# Patient Record
Sex: Female | Born: 1965 | Race: White | Hispanic: No | Smoking: Current some day smoker
Health system: Southern US, Community
[De-identification: ages and names within clinical notes are randomized; demographics above are authoritative.]

## PROBLEM LIST (undated history)

## (undated) DIAGNOSIS — F329 Major depressive disorder, single episode, unspecified: Secondary | ICD-10-CM

## (undated) DIAGNOSIS — F32A Depression, unspecified: Secondary | ICD-10-CM

## (undated) DIAGNOSIS — I10 Essential (primary) hypertension: Secondary | ICD-10-CM

## (undated) HISTORY — PX: BACK SURGERY: SHX140

## (undated) HISTORY — PX: ADENOIDECTOMY: SUR15

## (undated) HISTORY — PX: EYE SURGERY: SHX253

## (undated) HISTORY — PX: SPINE SURGERY: SHX786

## (undated) HISTORY — PX: TONSILLECTOMY: SUR1361

## (undated) HISTORY — PX: ABDOMINAL HYSTERECTOMY: SHX81

## (undated) HISTORY — DX: Essential (primary) hypertension: I10

---

## 1999-04-01 ENCOUNTER — Inpatient Hospital Stay (HOSPITAL_COMMUNITY): Admission: AD | Admit: 1999-04-01 | Discharge: 1999-04-05 | Payer: Self-pay | Admitting: *Deleted

## 1999-05-16 ENCOUNTER — Other Ambulatory Visit: Admission: RE | Admit: 1999-05-16 | Discharge: 1999-05-16 | Payer: Self-pay | Admitting: *Deleted

## 2000-04-27 ENCOUNTER — Ambulatory Visit (HOSPITAL_COMMUNITY): Admission: RE | Admit: 2000-04-27 | Discharge: 2000-04-28 | Payer: Self-pay | Admitting: Ophthalmology

## 2000-04-27 ENCOUNTER — Encounter: Payer: Self-pay | Admitting: Ophthalmology

## 2000-05-06 ENCOUNTER — Ambulatory Visit (HOSPITAL_COMMUNITY): Admission: RE | Admit: 2000-05-06 | Discharge: 2000-05-07 | Payer: Self-pay | Admitting: Ophthalmology

## 2000-10-28 ENCOUNTER — Encounter: Payer: Self-pay | Admitting: Emergency Medicine

## 2000-10-28 ENCOUNTER — Emergency Department (HOSPITAL_COMMUNITY): Admission: EM | Admit: 2000-10-28 | Discharge: 2000-10-28 | Payer: Self-pay | Admitting: Emergency Medicine

## 2000-11-10 ENCOUNTER — Other Ambulatory Visit: Admission: RE | Admit: 2000-11-10 | Discharge: 2000-11-10 | Payer: Self-pay | Admitting: *Deleted

## 2001-11-01 ENCOUNTER — Other Ambulatory Visit: Admission: RE | Admit: 2001-11-01 | Discharge: 2001-11-01 | Payer: Self-pay | Admitting: *Deleted

## 2004-12-18 ENCOUNTER — Emergency Department (HOSPITAL_COMMUNITY): Admission: EM | Admit: 2004-12-18 | Discharge: 2004-12-18 | Payer: Self-pay | Admitting: Family Medicine

## 2005-03-20 ENCOUNTER — Ambulatory Visit: Payer: Self-pay | Admitting: Psychiatry

## 2005-03-20 ENCOUNTER — Inpatient Hospital Stay (HOSPITAL_COMMUNITY): Admission: RE | Admit: 2005-03-20 | Discharge: 2005-03-22 | Payer: Self-pay | Admitting: Psychiatry

## 2005-03-20 ENCOUNTER — Emergency Department (HOSPITAL_COMMUNITY): Admission: EM | Admit: 2005-03-20 | Discharge: 2005-03-20 | Payer: Self-pay | Admitting: Emergency Medicine

## 2005-10-06 ENCOUNTER — Other Ambulatory Visit: Admission: RE | Admit: 2005-10-06 | Discharge: 2005-10-06 | Payer: Self-pay | Admitting: Obstetrics & Gynecology

## 2005-10-13 ENCOUNTER — Encounter: Admission: RE | Admit: 2005-10-13 | Discharge: 2005-10-13 | Payer: Self-pay | Admitting: Obstetrics & Gynecology

## 2009-01-29 ENCOUNTER — Encounter: Admission: RE | Admit: 2009-01-29 | Discharge: 2009-01-29 | Payer: Self-pay | Admitting: Internal Medicine

## 2010-09-08 ENCOUNTER — Emergency Department (HOSPITAL_COMMUNITY)
Admission: EM | Admit: 2010-09-08 | Discharge: 2010-09-08 | Disposition: A | Discharge status: Home / Self Care | Payer: Self-pay | Attending: Emergency Medicine | Admitting: Emergency Medicine

## 2010-09-08 ENCOUNTER — Emergency Department (HOSPITAL_COMMUNITY): Payer: Self-pay

## 2010-09-08 DIAGNOSIS — R079 Chest pain, unspecified: Secondary | ICD-10-CM | POA: Insufficient documentation

## 2010-09-08 DIAGNOSIS — R000 Tachycardia, unspecified: Secondary | ICD-10-CM | POA: Insufficient documentation

## 2010-09-08 DIAGNOSIS — F1410 Cocaine abuse, uncomplicated: Secondary | ICD-10-CM | POA: Insufficient documentation

## 2010-09-08 LAB — CBC
MCV: 87.5 fL (ref 78.0–100.0)
Platelets: 341 10*3/uL (ref 150–400)
RBC: 4.8 MIL/uL (ref 3.87–5.11)
WBC: 15.4 10*3/uL — ABNORMAL HIGH (ref 4.0–10.5)

## 2010-09-08 LAB — DIFFERENTIAL
Basophils Relative: 0 % (ref 0–1)
Eosinophils Absolute: 0 10*3/uL (ref 0.0–0.7)
Lymphs Abs: 1.8 10*3/uL (ref 0.7–4.0)
Neutrophils Relative %: 79 % — ABNORMAL HIGH (ref 43–77)

## 2010-09-08 LAB — COMPREHENSIVE METABOLIC PANEL
ALT: 25 U/L (ref 0–35)
Albumin: 4.3 g/dL (ref 3.5–5.2)
Alkaline Phosphatase: 61 U/L (ref 39–117)
Potassium: 3.6 mEq/L (ref 3.5–5.1)
Sodium: 135 mEq/L (ref 135–145)
Total Protein: 7.4 g/dL (ref 6.0–8.3)

## 2010-09-08 LAB — D-DIMER, QUANTITATIVE: D-Dimer, Quant: 0.22 ug/mL-FEU (ref 0.00–0.48)

## 2010-12-19 NOTE — Op Note (Signed)
. [facility]  Patient:    Karen Serrano, Karen Serrano                    MRN: [id number] Proc. Date: 05/06/00 Adm. Date:  [id number] Disc. Date: [id number] Attending:  Bertrum Sol                           Operative Report  DATE OF BIRTH:  11/08/1965  ADMITTING DIAGNOSES:  Rhegmatogenous retinal detachment right eye, lattice degeneration right eye.  PROCEDURES:  Scleral buckle right eye, retinal photocoagulation right eye.  SURGEON:  Beulah Gandy. Ashley Royalty, M.D.  ASSISTANT:  Adela Ports, R.N.  ANESTHESIA:  General.  DESCRIPTION OF PROCEDURE:  Usual prep and drape, 360 degree ______  peritomy, isolation of four rectus muscles on 2-0 silk.  Scleral dissection from 3:30 oclock to 12 oclock to admit a #279 interscleral implant.  ______  placed in the bed.  Perforation site chosen at 9 oclock.  A large amount of clear caudalis subretinal fluid came forth.  Indirect ophthalmoscopy showed additional fluid remaining below.  Another second perforation site was chosen at 8 oclock in the posterior aspect of the bed.  A moderate amount of clear caudalis subretinal fluid came forth.  A 508 G radial segment was fashioned to fit beneath the bed and placed to support the break and the drain.  Scleral sutures were placed in each quadrant, two per quadrant.  The 240 band was placed around the eye with a 270 sleeve at 2 oclock, belt loop was at 2:30 oclock.  The buckle elements were placed, and the scleral flaps were closed. The indirect ophthalmoscopy showed the retina to be lying nicely on the scleral buckle with the proper indentation of the globe.  The band was adjusted and the ends trimmed.  The sutures were adjusted and the ends trimmed.  The conjunctiva was reposited with 7-0 chromic suture.  The indirect ophthalmoscope laser was moved into place.  824 burns were placed around the retinal periphery around the retinal breaks with a power of 600  milliwatts, 1000 microns each and .1 seconds each.  Once this was completed, all the fluid was gone from the subretinal space, and the conjunctiva was reposited. Polymyxin and gentamicin were irrigated in the tenons space.  Atropine solution was applied.  Decadron 20 mg was injected into the lower subconjunctival space.  Atropine solution was applied.  Polysporin, a patch, and shield were placed.  Closing tension was 10 with a Barr keratometer. Complications none.  Duration two hours.  The patient was awakened and taken to recovery in satisfactory condition. DD:  05/06/00 TD:  05/07/00 Job: [id number]

## 2010-12-19 NOTE — H&P (Signed)
NAME:  Karen Serrano, Karen Serrano             ACCOUNT NO.:  [id number]   MEDICAL RECORD NO.:  [id number]          PATIENT TYPE:  IPS   LOCATION:  0503                          [facility]ACILITY[facility]  [facility]   PHYSICIAN:  Anselm Jungling, MD  DATE OF BIRTH:  30-Dec-1965   DATE OF ADMISSION:  03/20/2005  DATE OF DISCHARGE:                         PSYCHIATRIC ADMISSION ASSESSMENT   IDENTIFYING INFORMATION:  This is a 45 year old single white female who does  have a significant other of 10 years.  Apparently she presented to the  emergency department at [facility] yesterday requesting detoxification from  alcohol.  Her alcohol level was 8 and her urine drug screen was positive for  THC.  The patient states that last year from February until August she was  sober.  She was also sober again this year at the end of April, however she  relapsed in June while at the beach with her [place].   PAST PSYCHIATRIC HISTORY:  She states that she has been depressed since age  76 until she was about 7.  After she had her son, her chemicals balanced  out.  However in April of 2002 she had her first panic attack.  She was  started on Paxil.  She gained over 40 pounds.  She has had a variety of  medication trials throughout her lifetime.  She is currently prescribed  Lexapro 20 mg p.o. daily and Klonopin 0.5 mg p.o. b.i.d. by her primary care  Jessejames Steelman, Dr[facility] Particia Lather [facility].   SOCIAL HISTORY:  She has had some college.  She is employed as an Arts development officer.  She has been married once.  She is now in a relationship for over 10  years.  They have one son, age 31, and she has 2 step-daughters ages 49 and  58.   FAMILY HISTORY:  She states her whole family is alcoholic, both sides.   ALCOHOL AND DRUG HISTORY:  She began drinking alcohol around age 75.  She  noticed that it calmed her tremors.  She has some degree of shakiness, and  she has been using it on and off for the past 10 years.   PAST MEDICAL HISTORY:  She  had back surgery in January of 1993 and January  of 1995.  This was secondary to a herniated disk through weightlifting.  In  August of 2001, both retinas detached bilaterally.  She states that she has  all the signs and symptoms of thyroid disease, however her tests never  proved that out.  She is scheduled to see Dr. Leslie Dales, an endocrinologist,  on August 30.   MEDICATIONS:  As already stated, she is currently prescribed Lexapro 20 mg  p.o. daily, and Klonopin she can take 0.5 up to b.i.d.  She states that when  and if she takes it she takes a half of the 0.5 once a day.   ALLERGIES:  No known drug allergies.   POSITIVE PHYSICAL FINDINGS:  PHYSICAL EXAMINATION:  Was documented in the  emergency room.  She displays no tremor today, in fact she is quite  surprised.  She said she  has never been this calm.  Vital signs show that  she is 69 inches tall, weighs 210.  Temperature is 97.  Her blood pressure  ranges from 147/103 to 150/108, and her pulse is 105 to 106, and  respirations are 26.  The remainder of her physical exam is well documented  from the emergency room.   MENTAL STATUS EXAM:  She is alert and oriented x3.  Her appearance and  behavior show that she is appropriately groomed, dressed, and adequately  nourished.  Her speech is not pressured.  Her mood is depressed and anxious  but appropriate to the situation.  Her thought processes are clear, rational  and goal oriented.  She wants to get detoxed.  Judgment and insight are  intact.  Concentration and memory are intact.  Intelligence is at least  average.  She denies suicidal or homicidal ideation.  She denies auditory or  visual hallucinations.   ADMISSION DIAGNOSES:  AXIS I:  Alcohol dependence and anxiety.  AXIS II:  Deferred.  AXIS III:  Status post back surgeries and bilateral detached retinas, rule  out endocrine disease.  AXIS IV:  Work and using alcohol.  AXIS V:  Global assessment of function is 45.   PLAN:   The plan is to support through detoxification, to augment her  medications if indicated with Camprel, and to help set her up with further  support once discharged.      Mickie Leonarda Salon, P.A.-C.      Anselm Jungling, MD  Electronically Signed    MD/MEDQ  D:  03/21/2005  T:  03/21/2005  Job:  [id number]

## 2010-12-19 NOTE — Op Note (Signed)
[place]  Patient:    Karen Serrano, Karen Serrano                    MRN: [id number] Proc. Date: 04/27/00 Adm. Date:  [id number] Disc. Date: [id number] Attending:  Bertrum Sol                           Operative Report  DATE OF BIRTH:  09-Oct-1965  PREOPERATIVE DIAGNOSIS:  Rhegmatogenous retinal detachment, left eye.  PROCEDURE: 1. Scleral buckle, left eye. 2. Retinal photocoagulation, left eye.  SURGEON:  Beulah Gandy. Ashley Royalty, M.D.  ASSISTANT:  Alma Downs, P.A.  ANESTHESIA:  General.  DESCRIPTION OF PROCEDURE:  The usual prep and drape.  A 360-degree limbal peritomy.  Isolation of four rectus muscles on #2-0 silk.  Localization of a break at 5 oclock.  Scleral dissection from 3 oclock to 6 oclock to admit a #279 intrascleral implant.  Diathermy placed in the bed.  The #279 implant placed against the bed.  A 508-G segment shaped in a radial shape to go beneath the #279 implant.  A #240 band was placed around the eye with a #370 sleeve at 10 oclock, and belt loops at 11 oclock, 2 oclock, 3 oclock, 8 oclock, and 9 oclock.  The perforation site in the anterior aspect of the bed.  A large amount of clear colorless subretinal fluid came forth.  Once this was complete, the buckle elements were placed.  The 508-G segment was placed.  The scleral flaps were closed with three interrupted #7-0 Mersilene sutures.  The band was adjusted to a proper indentation of the globe.  An indirect ophthalmoscopy showed the retina to be lying nicely on the scleral buckle.  The indirect ophthalmoscopy showed the retina to be lying nicely on the scleral buckle.  The indirect ophthalmoscope laser was moved into place, and 1026 burns with a power of 500 mW, 1000 microns each, and 0.1 second each were placed around the breaks on the buckle, and around multiple breaks in the superior half of the retina from lattice degeneration.  Once this was accomplished the  buckle ends were trimmed.  The sutures were trimmed, and the band ends were trimmed.  The conjunctiva was reposited with #7-0 chromic suture.  Polymyxin and gentamicin were irrigated into Tenons space.  Atropine solution was applied.  Decadron 10 mg was injected into the lower subconjunctival space.  Marcaine was injected around the globe for postoperative pain.  Polysporin, a patch, and a shield were placed.  The closing tension was 10 with the Barraquer tonometer.  Complications:  None. Duration of the procedure was two hours.  The patient was awakened and taken to the recovery room in satisfactory condition.DD:  04/27/00 TD:  04/28/00 Job: [id number]

## 2010-12-19 NOTE — Discharge Summary (Signed)
NAME:  Karen Serrano, Karen Serrano             ACCOUNT NO.:  [id number]   MEDICAL RECORD NO.:  [id number]          PATIENT TYPE:  IPS   LOCATION:  0503                          [facility]ILITY[facility]  [facility]   PHYSICIAN:  Anselm Jungling, MD  DATE OF BIRTH:  11-25-1965   DATE OF ADMISSION:  03/20/2005  DATE OF DISCHARGE:  03/22/2005                                 DISCHARGE SUMMARY   IDENTIFYING DATA AND REASON FOR ADMISSION:  This was the first [facility] admission  for Karen Serrano, a 45 year old female admitted for detoxification from alcohol  dependence.  She also came to [place] with a history of some depression as well,  but was admitted without any current suicidal ideation.  She had presented  herself for detoxification at the emergency department.  She had been taking  Lexapro 20 mg daily.  Please refer to the admission note for further details  pertaining to the symptoms, circumstances, and history that led to her  hospitalization at [facility].   MEDICAL AND LABORATORY:  There were no significant medical issues during  this brief inpatient psychiatric stay.  An admission CBC was within normal  limits.  Routine chemistry was only significant for reduced albumin at 3.2.  A TSH level was within normal limits.  The patient was given a multivitamin  daily and calcium plus daily.   HOSPITAL COURSE:  The patient was admitted to the adult inpatient service  where she was placed on a Librium protocol and given her usual Lexapro for  depression.  Her detoxification proceeded uneventfully.  On the third  hospital day, she indicated that she was feeling physically better, sleeping  well, with good appetite.  Her mood was continuing to be somewhat depressed  but she had no suicidal ideation.  She expressed interest in the [facility]  Dependency Intensive Outpatient Program, but also anticipated getting  heavily involved in Alcoholics Anonymous.   The patient was discharged on the third hospital day.   AFTERCARE PLANS:  The patient was  discharged on a regimen of Lexapro 20 mg  p.o. daily, Klonopin 0.5 mg p.r.n. up to b.i.d. for anxiety.  She was to  follow up at the [facility] Intensive Outpatient Program immediately following her  discharge from the inpatient service.   DISCHARGE DIAGNOSES:  AXIS I:  Alcohol dependence, depressive disorder not  otherwise specified.  AXIS II:  Deferred.  AXIS III:  No acute or chronic illnesses.  AXIS IV:  Stressors, severe.  AXIS V:  Global assessment of function on discharge 65.           ______________________________  Anselm Jungling, MD  Electronically Signed     SPB/MEDQ  D:  04/16/2005  T:  04/16/2005  Job:  [id number]

## 2012-09-02 ENCOUNTER — Emergency Department (HOSPITAL_COMMUNITY): Payer: Medicare Other

## 2012-09-02 ENCOUNTER — Encounter (HOSPITAL_COMMUNITY): Payer: Self-pay

## 2012-09-02 ENCOUNTER — Emergency Department (HOSPITAL_COMMUNITY)
Admission: EM | Admit: 2012-09-02 | Discharge: 2012-09-02 | Discharge status: Against Medical Advice | Payer: Medicare Other | Attending: Emergency Medicine | Admitting: Emergency Medicine

## 2012-09-02 DIAGNOSIS — F17200 Nicotine dependence, unspecified, uncomplicated: Secondary | ICD-10-CM | POA: Insufficient documentation

## 2012-09-02 DIAGNOSIS — F3289 Other specified depressive episodes: Secondary | ICD-10-CM | POA: Insufficient documentation

## 2012-09-02 DIAGNOSIS — Z79899 Other long term (current) drug therapy: Secondary | ICD-10-CM | POA: Insufficient documentation

## 2012-09-02 DIAGNOSIS — E860 Dehydration: Secondary | ICD-10-CM

## 2012-09-02 DIAGNOSIS — J40 Bronchitis, not specified as acute or chronic: Secondary | ICD-10-CM

## 2012-09-02 DIAGNOSIS — R509 Fever, unspecified: Secondary | ICD-10-CM | POA: Insufficient documentation

## 2012-09-02 DIAGNOSIS — R110 Nausea: Secondary | ICD-10-CM | POA: Insufficient documentation

## 2012-09-02 DIAGNOSIS — R0602 Shortness of breath: Secondary | ICD-10-CM | POA: Insufficient documentation

## 2012-09-02 DIAGNOSIS — F329 Major depressive disorder, single episode, unspecified: Secondary | ICD-10-CM | POA: Insufficient documentation

## 2012-09-02 DIAGNOSIS — R51 Headache: Secondary | ICD-10-CM | POA: Insufficient documentation

## 2012-09-02 DIAGNOSIS — J3489 Other specified disorders of nose and nasal sinuses: Secondary | ICD-10-CM | POA: Insufficient documentation

## 2012-09-02 DIAGNOSIS — R062 Wheezing: Secondary | ICD-10-CM | POA: Insufficient documentation

## 2012-09-02 HISTORY — DX: Depression, unspecified: F32.A

## 2012-09-02 HISTORY — DX: Major depressive disorder, single episode, unspecified: F32.9

## 2012-09-02 LAB — URINALYSIS, ROUTINE W REFLEX MICROSCOPIC
Glucose, UA: NEGATIVE mg/dL
Ketones, ur: 15 mg/dL — AB
Leukocytes, UA: NEGATIVE
pH: 7 (ref 5.0–8.0)

## 2012-09-02 LAB — CBC WITH DIFFERENTIAL/PLATELET
Eosinophils Relative: 0 % (ref 0–5)
HCT: 40.6 % (ref 36.0–46.0)
Lymphocytes Relative: 12 % (ref 12–46)
Lymphs Abs: 0.9 10*3/uL (ref 0.7–4.0)
MCV: 91.2 fL (ref 78.0–100.0)
Monocytes Absolute: 1.1 10*3/uL — ABNORMAL HIGH (ref 0.1–1.0)
Platelets: 247 10*3/uL (ref 150–400)
RBC: 4.45 MIL/uL (ref 3.87–5.11)
WBC: 7.6 10*3/uL (ref 4.0–10.5)

## 2012-09-02 LAB — POCT I-STAT, CHEM 8
Calcium, Ion: 1.12 mmol/L (ref 1.12–1.23)
Creatinine, Ser: 0.9 mg/dL (ref 0.50–1.10)
Glucose, Bld: 96 mg/dL (ref 70–99)
Hemoglobin: 14.6 g/dL (ref 12.0–15.0)
Sodium: 136 mEq/L (ref 135–145)
TCO2: 23 mmol/L (ref 0–100)

## 2012-09-02 LAB — RAPID URINE DRUG SCREEN, HOSP PERFORMED
Amphetamines: NOT DETECTED
Benzodiazepines: NOT DETECTED
Cocaine: NOT DETECTED

## 2012-09-02 MED ORDER — DIPHENHYDRAMINE HCL 50 MG/ML IJ SOLN
25.0000 mg | Freq: Once | INTRAMUSCULAR | Status: AC
  Administered 2012-09-02: 25 mg via INTRAVENOUS
  Filled 2012-09-02: qty 1

## 2012-09-02 MED ORDER — LORAZEPAM 2 MG/ML IJ SOLN
1.0000 mg | Freq: Once | INTRAMUSCULAR | Status: AC
  Administered 2012-09-02: 1 mg via INTRAVENOUS
  Filled 2012-09-02: qty 1

## 2012-09-02 MED ORDER — SODIUM CHLORIDE 0.9 % IV SOLN
1000.0000 mL | Freq: Once | INTRAVENOUS | Status: AC
  Administered 2012-09-02: 1000 mL via INTRAVENOUS

## 2012-09-02 MED ORDER — SODIUM CHLORIDE 0.9 % IV SOLN: 1000.0000 mL | INTRAVENOUS | Status: DC

## 2012-09-02 MED ORDER — DM-GUAIFENESIN ER 30-600 MG PO TB12
1.0000 | ORAL_TABLET | Freq: Once | ORAL | Status: AC
  Administered 2012-09-02: 1 via ORAL
  Filled 2012-09-02: qty 1

## 2012-09-02 MED ORDER — METOCLOPRAMIDE HCL 5 MG/ML IJ SOLN
10.0000 mg | Freq: Once | INTRAMUSCULAR | Status: AC
  Administered 2012-09-02: 10 mg via INTRAVENOUS
  Filled 2012-09-02: qty 2

## 2012-09-02 MED ORDER — SODIUM CHLORIDE 0.9 % IV BOLUS (SEPSIS)
1000.0000 mL | Freq: Once | INTRAVENOUS | Status: AC
  Administered 2012-09-02: 1000 mL via INTRAVENOUS

## 2012-09-02 NOTE — ED Notes (Signed)
Pt requested to have IV out and to go home.  Explained to pt that the Dr. Lynelle Doctor wanted her to have a liter of IVF before leaving because  She is dehydrated, pt refused.  Removed iv and will notify Dr. Lynelle Doctor.

## 2012-09-02 NOTE — ED Notes (Signed)
Patient wants something for a headache. RN made aware.

## 2012-09-02 NOTE — ED Provider Notes (Signed)
History   This chart was scribed for Ward Givens, MD by Charolett Bumpers, ED Scribe. The patient was seen in room APA06/APA06. Patient's care was started at 0747.    CSN: [id number]  Arrival date & time 09/02/12  0740   First MD Initiated Contact with Patient 09/02/12 [phone]      Chief Complaint  Patient presents with  . Cough   The history is provided by the patient. No language interpreter was used.   Shala A Yarbro is a 47 y.o. female who presents to the Emergency Department via EMS complaining of persistent, severe productive cough with yellow phlegm that started yesterday morning. She reports associated severe headache ("like a hole is being bored into my head"), subjective fever, chills, nausea, rhinorrhea, mild SOB, wheezing. She denies any vomiting, diarrhea. She states her son was recently dx with bronchitis 3  days ago. She reports a h/o pneumonia and wheezing in which she has used inhalers in the past.   She also has a h/o bipolar disorder and states she gets very emotional when she is sick.   [facility] in [place])   Past Medical History  Diagnosis Date  . Depression     Past Surgical History  Procedure Date  . Back surgery   . Eye surgery   . Tonsillectomy   . Adenoidectomy   . Abdominal hysterectomy   . Cesarean section     No family history on file.  History  Substance Use Topics  . Smoking status: Smoker   . Smokeless tobacco: Not on file  . Alcohol Use: No   She admits to tobacco use, smoking a pack daily. No alcohol use.  She is disabled due to bipolar disorder.  She lives with fiance, her son and brother.   Review of Systems  Constitutional: Positive for fever and chills.  HENT: Positive for rhinorrhea. Negative for sore throat.   Respiratory: Positive for cough, shortness of breath and wheezing.   Gastrointestinal: Positive for nausea. Negative for vomiting and diarrhea.  Neurological: Positive for  headaches. Negative for syncope.  All other systems reviewed and are negative.    Allergies  Sulfa antibiotics  Home Medications   Current Outpatient Rx  Name  Route  Sig  Dispense  Refill  . CITALOPRAM HYDROBROMIDE 40 MG PO TABS   Oral   Take 40 mg by mouth daily.         Marland Kitchen CLONAZEPAM 0.5 MG PO TABS   Oral   Take 0.5 mg by mouth 2 (two) times daily as needed. Anxiety         . ADULT MULTIVITAMIN W/MINERALS CH   Oral   Take 1 tablet by mouth daily.         . QUETIAPINE FUMARATE 100 MG PO TABS   Oral   Take 100 mg by mouth at bedtime.         . TRAZODONE HCL 150 MG PO TABS   Oral   Take 150 mg by mouth at bedtime.         . VENLAFAXINE HCL 75 MG PO TABS   Oral   Take 150 mg by mouth 2 (two) times daily.           Triage Vitals: BP 136/77  Pulse 126  Temp 99.1 F (37.3 C) (Oral)  Resp 27  SpO2 97%  Vital signs normal except for tachycardia and low grade fever   Physical Exam  Nursing note  and vitals reviewed. Constitutional: She is oriented to person, place, and time. She appears well-developed and well-nourished. No distress.       Pt is noted to have hard coughing at times, does not appear to be productive, face gets red and then she starts sobbing more  HENT:  Head: Normocephalic and atraumatic.  Right Ear: External ear normal.  Left Ear: External ear normal.  Nose: Nose normal.  Mouth/Throat: Oropharynx is clear and moist. No oropharyngeal exudate.  Eyes: Conjunctivae normal and EOM are normal. Pupils are equal, round, and reactive to light.  Neck: Normal range of motion. Neck supple. No tracheal deviation present.  Cardiovascular: Normal rate, regular rhythm and normal heart sounds.  Exam reveals no gallop and no friction rub.   No murmur heard. Pulmonary/Chest: Effort normal and breath sounds normal. No respiratory distress. She has no wheezes. She has no rhonchi. She has no rales.  Abdominal: Soft. Bowel sounds are normal. She exhibits  no distension. There is no tenderness.  Musculoskeletal: Normal range of motion. She exhibits no edema.  Neurological: She is alert and oriented to person, place, and time.  Skin: Skin is warm and dry.  Psychiatric:       Pt is sobbing with quivering jaw. Seems very emotional distraught.     ED Course  Procedures (including critical care time)   Medications  0.9 %  sodium chloride infusion (0 mL Intravenous Stopped 09/02/12 1020)    Followed by  0.9 %  sodium chloride infusion (not administered)  metoCLOPramide (REGLAN) injection 10 mg (10 mg Intravenous Given 09/02/12 0827)  diphenhydrAMINE (BENADRYL) injection 25 mg (25 mg Intravenous Given 09/02/12 0827)  LORazepam (ATIVAN) injection 1 mg (1 mg Intravenous Given 09/02/12 0827)  dextromethorphan-guaiFENesin (MUCINEX DM) 30-600 MG per 12 hr tablet 1 tablet (1 tablet Oral Given 09/02/12 0828)  sodium chloride 0.9 % bolus 1,000 mL (1000 mL Intravenous New Bag/Given 09/02/12 1021)     DIAGNOSTIC STUDIES: Oxygen Saturation is 97% on room air, adeqaute by my interpretation.    COORDINATION OF CARE:  08:00-Discussed planned course of treatment with the patient including chest x-ray, blood work, UA, who is agreeable at this time.   09:31-Recheck: Informed pt of normal chest x-ray and lab results. Lungs are still clear on re-evaluation.  11:06-Recheck: Pt is still receiving IV fluids and is trying to call someone for a ride.   12:00-Pt did not stay to finish her IV fluids. States her ride is here and she needs to leave. Pt did not wait for d/c instructions and left.   Of note patient crying continuously while in the ED.   Results for orders placed during the hospital encounter of 09/02/12  CBC WITH DIFFERENTIAL      Component Value Range   WBC 7.6  4.0 - 10.5 K/uL   RBC 4.45  3.87 - 5.11 MIL/uL   Hemoglobin 14.0  12.0 - 15.0 g/dL   HCT 19.1  47.8 - 29.5 %   MCV 91.2  78.0 - 100.0 fL   MCH 31.5  26.0 - 34.0 pg   MCHC 34.5  30.0 -  36.0 g/dL   RDW 62.1  30.8 - 65.7 %   Platelets 247  150 - 400 K/uL   Neutrophils Relative 73  43 - 77 %   Neutro Abs 5.6  1.7 - 7.7 K/uL   Lymphocytes Relative 12  12 - 46 %   Lymphs Abs 0.9  0.7 - 4.0 K/uL   Monocytes Relative  15 (*) 3 - 12 %   Monocytes Absolute 1.1 (*) 0.1 - 1.0 K/uL   Eosinophils Relative 0  0 - 5 %   Eosinophils Absolute 0.0  0.0 - 0.7 K/uL   Basophils Relative 0  0 - 1 %   Basophils Absolute 0.0  0.0 - 0.1 K/uL  URINALYSIS, ROUTINE W REFLEX MICROSCOPIC      Component Value Range   Color, Urine YELLOW  YELLOW   APPearance CLEAR  CLEAR   Specific Gravity, Urine 1.015  1.005 - 1.030   pH 7.0  5.0 - 8.0   Glucose, UA NEGATIVE  NEGATIVE mg/dL   Hgb urine dipstick NEGATIVE  NEGATIVE   Bilirubin Urine NEGATIVE  NEGATIVE   Ketones, ur 15 (*) NEGATIVE mg/dL   Protein, ur TRACE (*) NEGATIVE mg/dL   Urobilinogen, UA 0.2  0.0 - 1.0 mg/dL   Nitrite NEGATIVE  NEGATIVE   Leukocytes, UA NEGATIVE  NEGATIVE  URINE RAPID DRUG SCREEN (HOSP PERFORMED)      Component Value Range   Opiates NONE DETECTED  NONE DETECTED   Cocaine NONE DETECTED  NONE DETECTED   Benzodiazepines NONE DETECTED  NONE DETECTED   Amphetamines NONE DETECTED  NONE DETECTED   Tetrahydrocannabinol POSITIVE (*) NONE DETECTED   Barbiturates NONE DETECTED  NONE DETECTED  POCT I-STAT, CHEM 8      Component Value Range   Sodium 136  135 - 145 mEq/L   Potassium 3.7  3.5 - 5.1 mEq/L   Chloride 104  96 - 112 mEq/L   BUN 4 (*) 6 - 23 mg/dL   Creatinine, Ser 1.61  0.50 - 1.10 mg/dL   Glucose, Bld 96  70 - 99 mg/dL   Calcium, Ion 0.96  0.45 - 1.23 mmol/L   TCO2 23  0 - 100 mmol/L   Hemoglobin 14.6  12.0 - 15.0 g/dL   HCT 40.9  81.1 - 91.4 %  URINE MICROSCOPIC-ADD ON      Component Value Range   Squamous Epithelial / LPF FEW (*) RARE    Laboratory interpretation all normal   Dg Chest 2 View  09/02/2012  *RADIOLOGY REPORT*  Clinical Data: Cough  CHEST - 2 VIEW  Comparison: 09/08/2010  Findings: Mild  hyperinflation evident.  Normal heart size and vascularity.  No focal pneumonia, collapse, consolidation, effusion or pneumothorax.  Trachea is midline.  No osseous abnormality.  IMPRESSION: Hyperinflation without acute process   Original Report Authenticated By: Judie Petit. Shick, M.D.      1. Bronchitis   2. Dehydration    Pt left AMA   MDM    I personally performed the services described in this documentation, which was scribed in my presence. The recorded information has been reviewed and considered.  Devoria Albe, MD, Armando Gang      Ward Givens, MD 09/02/12 [phone]

## 2012-09-02 NOTE — ED Notes (Signed)
Pt tearful, says she can't find her cell phone.  Pt states she gave it to the female with EMS.  Called [facility] and they are getting in touch with the unit that brought her in.

## 2012-09-02 NOTE — ED Notes (Signed)
Pt says is unable to provide urine sample at this time. Instructed her to notify staff when she could.

## 2012-09-02 NOTE — ED Notes (Signed)
Pt tearful, says she has severe depression and everything upsets her.  Denies SI or HI.

## 2012-09-02 NOTE — ED Notes (Signed)
Pt drank can of  gingerale.

## 2012-09-02 NOTE — ED Notes (Signed)
Pt arrived ems, c/o cold symptoms x 4 days.  C/O headache.

## 2012-09-02 NOTE — ED Notes (Signed)
Patient states she can not give a urine sample at this time she will try in a little while.

## 2012-09-02 NOTE — ED Notes (Signed)
RN at bedside

## 2013-03-11 ENCOUNTER — Ambulatory Visit: Payer: Medicare Other

## 2013-03-11 ENCOUNTER — Ambulatory Visit (INDEPENDENT_AMBULATORY_CARE_PROVIDER_SITE_OTHER): Payer: Medicare Other | Admitting: Emergency Medicine

## 2013-03-11 VITALS — BP 108/78 | HR 105 | Temp 97.9°F | Resp 16 | Ht 67.0 in | Wt 131.6 lb

## 2013-03-11 DIAGNOSIS — M7552 Bursitis of left shoulder: Secondary | ICD-10-CM

## 2013-03-11 DIAGNOSIS — M67919 Unspecified disorder of synovium and tendon, unspecified shoulder: Secondary | ICD-10-CM

## 2013-03-11 DIAGNOSIS — M25512 Pain in left shoulder: Secondary | ICD-10-CM

## 2013-03-11 DIAGNOSIS — M25519 Pain in unspecified shoulder: Secondary | ICD-10-CM

## 2013-03-11 MED ORDER — METHYLPREDNISOLONE ACETATE 80 MG/ML IJ SUSP: 80.0000 mg | Freq: Once | INTRAMUSCULAR | Status: DC

## 2013-03-11 MED ORDER — MELOXICAM 15 MG PO TABS: 15.0000 mg | ORAL_TABLET | Freq: Every day | ORAL | Status: DC

## 2013-03-11 NOTE — Patient Instructions (Addendum)
Shoulder Pain  The shoulder is the joint that connects your arms to your body. The bones that form the shoulder joint include the upper arm bone (humerus), the shoulder blade (scapula), and the collarbone (clavicle). The top of the humerus is shaped like a ball and fits into a rather flat socket on the scapula (glenoid cavity). A combination of muscles and strong, fibrous tissues that connect muscles to bones (tendons) support your shoulder joint and hold the ball in the socket. Small, fluid-filled sacs (bursae) are located in different areas of the joint. They act as cushions between the bones and the overlying soft tissues and help reduce friction between the gliding tendons and the bone as you move your arm. Your shoulder joint allows a wide range of motion in your arm. This range of motion allows you to do things like scratch your back or throw a ball. However, this range of motion also makes your shoulder more prone to pain from overuse and injury.  Causes of shoulder pain can originate from both injury and overuse and usually can be grouped in the following four categories:   Redness, swelling, and pain (inflammation) of the tendon (tendinitis) or the bursae (bursitis).   Instability, such as a dislocation of the joint.   Inflammation of the joint (arthritis).   Broken bone (fracture).  HOME CARE INSTRUCTIONS    Apply ice to the sore area.   Put ice in a plastic bag.   Place a towel between your skin and the bag.   Leave the ice on for 15-20 minutes, 3-4 times per day for the first 2 days.   Stop using cold packs if they do not help with the pain.   If you have a shoulder sling or immobilizer, wear it as long as your caregiver instructs. Only remove it to shower or bathe. Move your arm as little as possible, but keep your hand moving to prevent swelling.   Squeeze a soft ball or foam pad as much as possible to help prevent swelling.   Only take over-the-counter or prescription medicines for pain,  discomfort, or fever as directed by your caregiver.  SEEK MEDICAL CARE IF:    Your shoulder pain increases, or new pain develops in your arm, hand, or fingers.   Your hand or fingers become cold and numb.   Your pain is not relieved with medicines.  SEEK IMMEDIATE MEDICAL CARE IF:    Your arm, hand, or fingers are numb or tingling.   Your arm, hand, or fingers are significantly swollen or turn white or blue.  MAKE SURE YOU:    Understand these instructions.   Will watch your condition.   Will get help right away if you are not doing well or get worse.  Document Released: 04/29/2005 Document Revised: 04/13/2012 Document Reviewed: 07/04/2011  [employer] Patient Information 2014 [employer].

## 2013-03-11 NOTE — Progress Notes (Signed)
  Subjective:    Karen Serrano is a 47 y.o. female who presents with left shoulder pain. The symptoms began several days ago. Aggravating factors: no known event. Pain is located diffusely throughout the shoulder. Discomfort is described as aching and burning. Symptoms are exacerbated by overhead movements. Evaluation to date: none. Therapy to date includes: rest, avoidance of offending activity and OTC analgesics which are not very effective.  The following portions of the patient's history were reviewed and updated as appropriate: allergies, current medications, past family history, past medical history, past social history, past surgical history and problem list.  Review of Systems A comprehensive review of systems was negative.   Objective:    BP 108/78  Pulse 105  Temp(Src) 97.9 F (36.6 C) (Oral)  Resp 16  Ht 5\' 7"  (1.702 m)  Wt 131 lb 9.6 oz (59.693 kg)  BMI 20.61 kg/m2  SpO2 100% Right shoulder: normal active ROM, no tenderness, no impingement sign  Left shoulder:  normal passive ROM tender glenohumeral joint. No impingement sign     Assessment:    Left shoulder pain     Plan:    Plain film x-rays.  Shoulder injection   [facility] reading (PRIMARY) by  Dr. Dareen Piano.  Negative Infected left shoulder with 80 depo and immediate improvement.Marland Kitchen

## 2013-03-13 DIAGNOSIS — Z0271 Encounter for disability determination: Secondary | ICD-10-CM

## 2013-05-24 ENCOUNTER — Ambulatory Visit (INDEPENDENT_AMBULATORY_CARE_PROVIDER_SITE_OTHER): Payer: Medicare Other | Admitting: *Deleted

## 2013-05-24 DIAGNOSIS — Z23 Encounter for immunization: Secondary | ICD-10-CM

## 2013-05-30 ENCOUNTER — Other Ambulatory Visit: Payer: Self-pay | Admitting: Nurse Practitioner

## 2013-05-30 DIAGNOSIS — R928 Other abnormal and inconclusive findings on diagnostic imaging of breast: Secondary | ICD-10-CM

## 2013-06-15 ENCOUNTER — Other Ambulatory Visit: Payer: Self-pay | Admitting: Nurse Practitioner

## 2013-06-15 DIAGNOSIS — R928 Other abnormal and inconclusive findings on diagnostic imaging of breast: Secondary | ICD-10-CM

## 2013-06-28 ENCOUNTER — Ambulatory Visit
Admission: RE | Admit: 2013-06-28 | Discharge: 2013-06-28 | Disposition: A | Discharge status: Home / Self Care | Payer: Medicare Other | Source: Ambulatory Visit | Attending: Nurse Practitioner | Admitting: Nurse Practitioner

## 2013-06-28 DIAGNOSIS — R928 Other abnormal and inconclusive findings on diagnostic imaging of breast: Secondary | ICD-10-CM

## 2014-11-26 IMAGING — CR DG CHEST 2V
2 series · 2 of 2 positions shown · non-contrast
Comparison: 09/08/2010

CLINICAL DATA: Cough

CHEST - 2 VIEW

[view not recorded (1 of 2)]
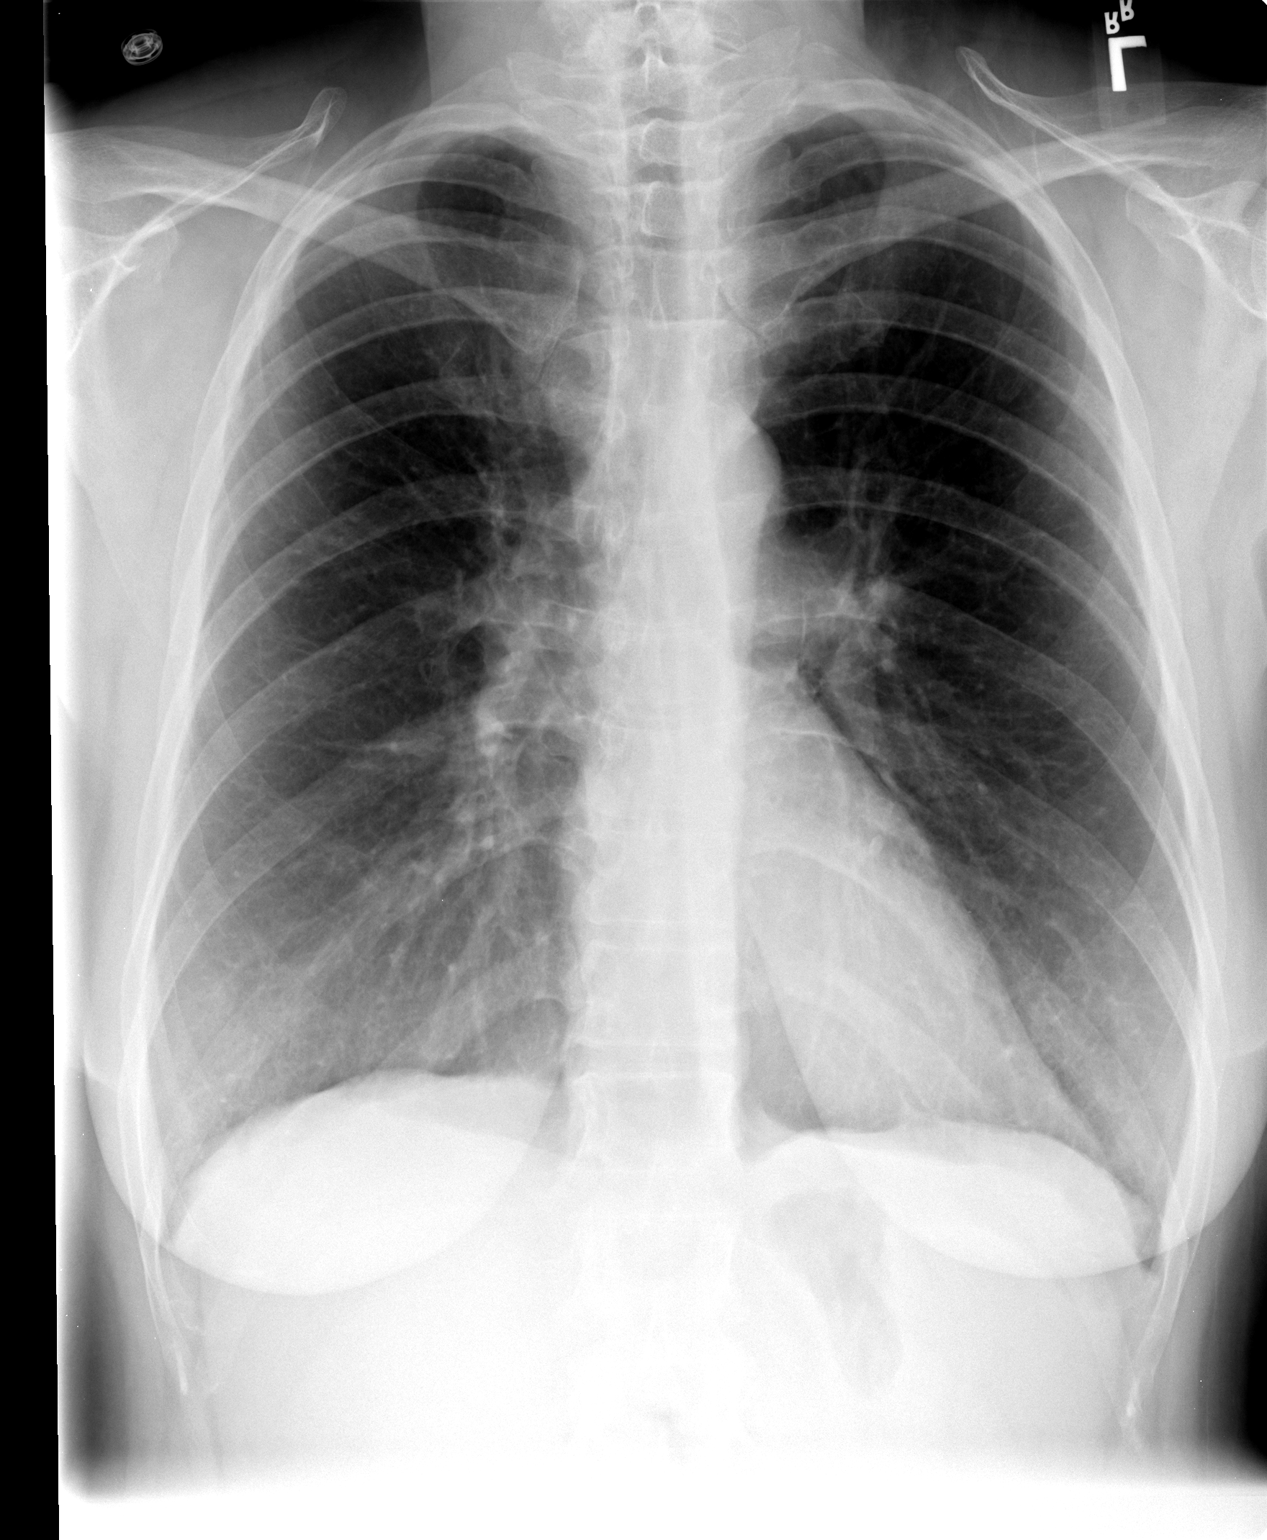

[view not recorded (2 of 2)]
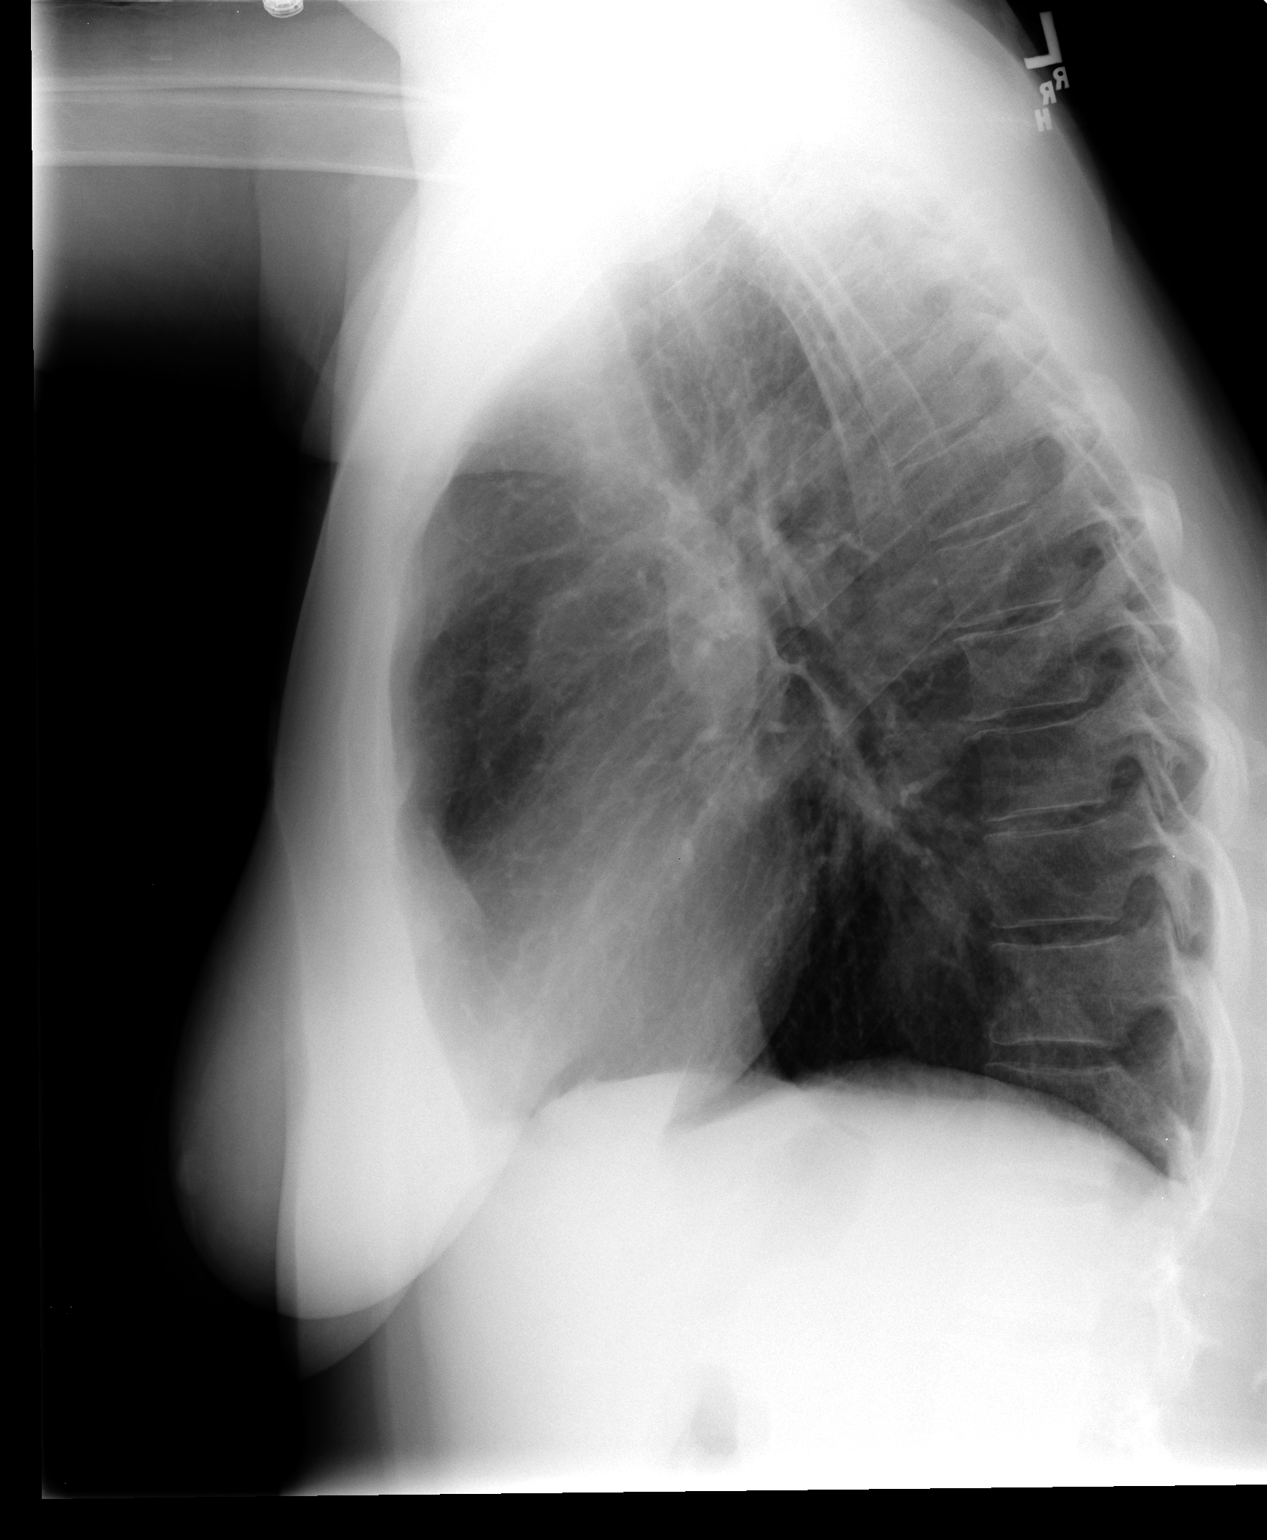

[2 of 2 positions shown; findings below may reference images not displayed]

FINDINGS: Mild hyperinflation evident.  Normal heart size and
vascularity.  No focal pneumonia, collapse, consolidation, effusion
or pneumothorax.  Trachea is midline.  No osseous abnormality.
IMPRESSION: Hyperinflation without acute process

## 2015-08-29 ENCOUNTER — Ambulatory Visit (INDEPENDENT_AMBULATORY_CARE_PROVIDER_SITE_OTHER): Payer: Medicare Other | Admitting: Nurse Practitioner

## 2015-08-29 ENCOUNTER — Encounter: Payer: Self-pay | Admitting: Nurse Practitioner

## 2015-08-29 VITALS — BP 151/103 | HR 108 | Temp 98.0°F | Ht 67.0 in | Wt 172.0 lb

## 2015-08-29 DIAGNOSIS — F3130 Bipolar disorder, current episode depressed, mild or moderate severity, unspecified: Secondary | ICD-10-CM | POA: Insufficient documentation

## 2015-08-29 DIAGNOSIS — F1123 Opioid dependence with withdrawal: Secondary | ICD-10-CM

## 2015-08-29 DIAGNOSIS — R251 Tremor, unspecified: Secondary | ICD-10-CM | POA: Diagnosis not present

## 2015-08-29 DIAGNOSIS — F1120 Opioid dependence, uncomplicated: Secondary | ICD-10-CM | POA: Insufficient documentation

## 2015-08-29 NOTE — Progress Notes (Signed)
   Subjective:    Patient ID: Karen Serrano, female    DOB: October 20, 1965, 50 y.o.   MRN: [id number]  HPI Patient in today with 2 complaints: - Wants to be seen by neurologist- thinks she has the beginnings of Parkinson's- Her dad died with Parkinson,s- says that she has been shaking all of her life but seems to be getting worse. Says that she is shaky all the time. Has never seen anyone to be diagnosed. - Says that she is addicted to pain pills and needs some help- wants to go to a saboxone clinic. Buys pills off the street- oxycodone or hydrocodone.  * has long history of bipolar disorder and is on seroquel and [facility] every 3 months  Review of Systems  Constitutional: Negative.   HENT: Negative.   Respiratory: Negative.   Cardiovascular: Negative.   Genitourinary: Negative.   Neurological: Positive for tremors.  Psychiatric/Behavioral: The patient is nervous/anxious.   All other systems reviewed and are negative.      Objective:   Physical Exam  Constitutional: She is oriented to person, place, and time. She appears well-developed and well-nourished.  Cardiovascular: Normal rate, regular rhythm and normal heart sounds.   Pulmonary/Chest: Effort normal and breath sounds normal.  Neurological: She is alert and oriented to person, place, and time.  Skin: Skin is warm.  Psychiatric: She has a normal mood and affect. Her behavior is normal. Judgment and thought content normal.  Good eye contact  Very tearful Answers all questions appropriately Very shaky - unable to seat still Gait slow and steady   BP 151/103 mmHg  Pulse 108  Temp(Src) 98 F (36.7 C) (Oral)  Ht  (1.702 m)  Wt 172 lb (78.019 kg)  BMI 26.93 kg/m2       Assessment & Plan:  1. Opioid dependence with withdrawal (HCC) Will get patient help ASAP Please try to avoid opiates when possible until get help - Ambulatory referral to Pain Clinic  2. Tremors of nervous system - Ambulatory referral  to Neurology  3. bipolar Stay on seroquel Continue counseling    [name], FNP

## 2016-01-24 ENCOUNTER — Encounter: Payer: Self-pay | Admitting: Family Medicine

## 2016-01-24 ENCOUNTER — Ambulatory Visit (INDEPENDENT_AMBULATORY_CARE_PROVIDER_SITE_OTHER): Payer: Medicare Other | Admitting: Family Medicine

## 2016-01-24 VITALS — BP 148/96 | HR 106 | Temp 97.8°F | Ht 67.0 in | Wt 186.6 lb

## 2016-01-24 DIAGNOSIS — R0600 Dyspnea, unspecified: Secondary | ICD-10-CM | POA: Diagnosis not present

## 2016-01-24 DIAGNOSIS — R0689 Other abnormalities of breathing: Secondary | ICD-10-CM

## 2016-01-24 NOTE — Progress Notes (Signed)
   Subjective:    Patient ID: Karen Serrano, female    DOB: [birth year], 50 y.o.   MRN: [id number]  HPI  Patient here today with complaints of dyspnea and congestion. Patient suffered a fractured rib several days ago per x-ray at the hospital. Now she cannot take a deep breath and she feels short of breath. Her oxygen saturation is 96%. I explained that sometimes people feel short of breath because they're hypoxic but sometimes is more of a mental perception rather than physiologic. She seemed to accept this explanation. She denies any upper airway or sinus congestion. She denies any cough.         Patient Active Problem List   Diagnosis Date Noted  . Opiate addiction (HCC) 08/29/2015  . Bipolar I disorder, most recent episode depressed (HCC) 08/29/2015   Outpatient Encounter Prescriptions as of 01/24/2016  Medication Sig  . citalopram (CELEXA) 40 MG tablet Take 40 mg by mouth daily.  . clonazePAM (KLONOPIN) 0.5 MG tablet Take 0.5 mg by mouth 2 (two) times daily as needed. Anxiety  . lamoTRIgine (LAMICTAL) 100 MG tablet   . Multiple Vitamin (MULTIVITAMIN WITH MINERALS) TABS Take 1 tablet by mouth daily.  . [DISCONTINUED] QUEtiapine (SEROQUEL) 100 MG tablet Take 100 mg by mouth at bedtime.  . [DISCONTINUED] QUEtiapine (SEROQUEL) 25 MG tablet    Facility-Administered Encounter Medications as of 01/24/2016  Medication  . methylPREDNISolone acetate (DEPO-MEDROL) injection 80 mg     Review of Systems  Constitutional: Negative.   HENT: Negative.   Eyes: Negative.   Respiratory: Positive for cough and shortness of breath.   Cardiovascular: Negative.   Gastrointestinal: Negative.   Endocrine: Negative.   Genitourinary: Negative.   Musculoskeletal: Negative.   Skin: Negative.   Allergic/Immunologic: Negative.   Neurological: Positive for syncope.  Hematological: Negative.   Psychiatric/Behavioral: Negative.   All other systems reviewed and are negative.      Objective:   Physical Exam  Constitutional: She is oriented to person, place, and time. She appears well-developed and well-nourished.  Cardiovascular: Normal rate and regular rhythm.   Pulmonary/Chest: Effort normal and breath sounds normal. Tenderness: left lateral chest.  Neurological: She is alert and oriented to person, place, and time.   BP 148/96 mmHg  Pulse 106  Temp(Src) 97.8 F (36.6 C) (Oral)  Ht 5\' 7"  (1.702 m)  Wt 186 lb 9.6 oz (84.641 kg)  BMI 29.22 kg/m2  SpO2 96%        Assessment & Plan:  .1. Dyspnea and respiratory abnormality This is a subjective feeling related to the rib fracture. Have reassured patient and she seems good with an explanation notme m .s

## 2016-03-04 ENCOUNTER — Other Ambulatory Visit: Payer: Self-pay | Admitting: Nurse Practitioner

## 2016-03-05 NOTE — Telephone Encounter (Signed)
Patient NTBS - has not ben sen for hypertensionand has not had blood work done

## 2016-03-05 NOTE — Telephone Encounter (Signed)
lmtcb

## 2016-03-06 ENCOUNTER — Encounter: Payer: Self-pay | Admitting: Nurse Practitioner

## 2016-03-06 ENCOUNTER — Ambulatory Visit (INDEPENDENT_AMBULATORY_CARE_PROVIDER_SITE_OTHER): Payer: Medicare Other | Admitting: Nurse Practitioner

## 2016-03-06 VITALS — BP 137/85 | HR 86 | Temp 97.2°F | Ht 67.0 in | Wt 185.0 lb

## 2016-03-06 DIAGNOSIS — I10 Essential (primary) hypertension: Secondary | ICD-10-CM | POA: Diagnosis not present

## 2016-03-06 DIAGNOSIS — F3130 Bipolar disorder, current episode depressed, mild or moderate severity, unspecified: Secondary | ICD-10-CM

## 2016-03-06 DIAGNOSIS — F1121 Opioid dependence, in remission: Secondary | ICD-10-CM

## 2016-03-06 DIAGNOSIS — Z6828 Body mass index (BMI) 28.0-28.9, adult: Secondary | ICD-10-CM | POA: Diagnosis not present

## 2016-03-06 LAB — CMP14+EGFR
ALK PHOS: 142 IU/L — AB (ref 39–117)
ALT: 15 IU/L (ref 0–32)
AST: 17 IU/L (ref 0–40)
Albumin/Globulin Ratio: 1.2 (ref 1.2–2.2)
Albumin: 3.7 g/dL (ref 3.5–5.5)
BILIRUBIN TOTAL: 0.2 mg/dL (ref 0.0–1.2)
BUN / CREAT RATIO: 12 (ref 9–23)
BUN: 9 mg/dL (ref 6–24)
CHLORIDE: 98 mmol/L (ref 96–106)
CO2: 26 mmol/L (ref 18–29)
CREATININE: 0.76 mg/dL (ref 0.57–1.00)
Calcium: 9.2 mg/dL (ref 8.7–10.2)
GFR calc Af Amer: 107 mL/min/{1.73_m2} (ref >59)
GFR calc non Af Amer: 93 mL/min/{1.73_m2} (ref >59)
GLUCOSE: 85 mg/dL (ref 65–99)
Globulin, Total: 3 g/dL (ref 1.5–4.5)
Potassium: 4.9 mmol/L (ref 3.5–5.2)
Sodium: 137 mmol/L (ref 134–144)
Total Protein: 6.7 g/dL (ref 6.0–8.5)

## 2016-03-06 LAB — LIPID PANEL
CHOLESTEROL TOTAL: 182 mg/dL (ref 100–199)
Chol/HDL Ratio: 3.8 ratio units (ref 0.0–4.4)
HDL: 48 mg/dL (ref >39)
LDL Calculated: 112 mg/dL — ABNORMAL HIGH (ref 0–99)
Triglycerides: 108 mg/dL (ref 0–149)
VLDL Cholesterol Cal: 22 mg/dL (ref 5–40)

## 2016-03-06 MED ORDER — LISINOPRIL 10 MG PO TABS: 10.0000 mg | ORAL_TABLET | Freq: Every day | ORAL | 3 refills | Status: DC

## 2016-03-06 NOTE — Patient Instructions (Signed)
Hypertension Hypertension, commonly called high blood pressure, is when the force of blood pumping through your arteries is too strong. Your arteries are the blood vessels that carry blood from your heart throughout your body. A blood pressure reading consists of a higher number over a lower number, such as 110/72. The higher number (systolic) is the pressure inside your arteries when your heart pumps. The lower number (diastolic) is the pressure inside your arteries when your heart relaxes. Ideally you want your blood pressure below 120/80. Hypertension forces your heart to work harder to pump blood. Your arteries may become narrow or stiff. Having untreated or uncontrolled hypertension can cause heart attack, stroke, kidney disease, and other problems. RISK FACTORS Some risk factors for high blood pressure are controllable. Others are not.  Risk factors you cannot control include:   Race. You may be at higher risk if you are African American.  Age. Risk increases with age.  Gender. Men are at higher risk than women before age 45 years. After age 65, women are at higher risk than men. Risk factors you can control include:  Not getting enough exercise or physical activity.  Being overweight.  Getting too much fat, sugar, calories, or salt in your diet.  Drinking too much alcohol. SIGNS AND SYMPTOMS Hypertension does not usually cause signs or symptoms. Extremely high blood pressure (hypertensive crisis) may cause headache, anxiety, shortness of breath, and nosebleed. DIAGNOSIS To check if you have hypertension, your health care provider will measure your blood pressure while you are seated, with your arm held at the level of your heart. It should be measured at least twice using the same arm. Certain conditions can cause a difference in blood pressure between your right and left arms. A blood pressure reading that is higher than normal on one occasion does not mean that you need treatment. If  it is not clear whether you have high blood pressure, you may be asked to return on a different day to have your blood pressure checked again. Or, you may be asked to monitor your blood pressure at home for 1 or more weeks. TREATMENT Treating high blood pressure includes making lifestyle changes and possibly taking medicine. Living a healthy lifestyle can help lower high blood pressure. You may need to change some of your habits. Lifestyle changes may include:  Following the DASH diet. This diet is high in fruits, vegetables, and whole grains. It is low in salt, red meat, and added sugars.  Keep your sodium intake below 2,300 mg per day.  Getting at least 30-45 minutes of aerobic exercise at least 4 times per week.  Losing weight if necessary.  Not smoking.  Limiting alcoholic beverages.  Learning ways to reduce stress. Your health care provider may prescribe medicine if lifestyle changes are not enough to get your blood pressure under control, and if one of the following is true:  You are 18-59 years of age and your systolic blood pressure is above 140.  You are 60 years of age or older, and your systolic blood pressure is above 150.  Your diastolic blood pressure is above 90.  You have diabetes, and your systolic blood pressure is over 140 or your diastolic blood pressure is over 90.  You have kidney disease and your blood pressure is above 140/90.  You have heart disease and your blood pressure is above 140/90. Your personal target blood pressure may vary depending on your medical conditions, your age, and other factors. HOME CARE INSTRUCTIONS    Have your blood pressure rechecked as directed by your health care provider.   Take medicines only as directed by your health care provider. Follow the directions carefully. Blood pressure medicines must be taken as prescribed. The medicine does not work as well when you skip doses. Skipping doses also puts you at risk for  problems.  Do not smoke.   Monitor your blood pressure at home as directed by your health care provider. SEEK MEDICAL CARE IF:   You think you are having a reaction to medicines taken.  You have recurrent headaches or feel dizzy.  You have swelling in your ankles.  You have trouble with your vision. SEEK IMMEDIATE MEDICAL CARE IF:  You develop a severe headache or confusion.  You have unusual weakness, numbness, or feel faint.  You have severe chest or abdominal pain.  You vomit repeatedly.  You have trouble breathing. MAKE SURE YOU:   Understand these instructions.  Will watch your condition.  Will get help right away if you are not doing well or get worse.   This information is not intended to replace advice given to you by your health care provider. Make sure you discuss any questions you have with your health care provider.   Document Released: 07/20/2005 Document Revised: 12/04/2014 Document Reviewed: 05/12/2013 Elsevier Interactive Patient Education 2016 Elsevier Inc.  

## 2016-03-06 NOTE — Progress Notes (Signed)
   Subjective:    Patient ID: Karen Serrano, female    DOB: 03-Sep-1965, 50 y.o.   MRN: [id number]  HPI  Patient here today for follow up of chronic medical problems.  Outpatient Encounter Prescriptions as of 03/06/2016  Medication Sig  . citalopram (CELEXA) 40 MG tablet Take 40 mg by mouth daily.  . clonazePAM (KLONOPIN) 0.5 MG tablet Take 0.5 mg by mouth 2 (two) times daily as needed. Anxiety  . lamoTRIgine (LAMICTAL) 100 MG tablet   . Multiple Vitamin (MULTIVITAMIN WITH MINERALS) TABS Take 1 tablet by mouth daily.  Marland Kitchen OLANZapine (ZYPREXA) 5 MG tablet   . propranolol (INDERAL) 20 MG tablet    Facility-Administered Encounter Medications as of 03/06/2016  Medication  . methylPREDNISolone acetate (DEPO-MEDROL) injection 80 mg   Opoid addiction She was on saboxin that she took daily- last opioids were taken 2 weeks ago. SHe said that the saboxin caused weight gain. Bipolar disorder with depresion Patient is currently on klonopin , lamictal and zyprexa which helps with mood swings- she is also on celexa for depressive component. SHe see Dr. Kandice Robinsons at [facility] every 3 months. Ses therapist every month.  * she has ad weight gain from the saboxin and her blood pressure started going up so her neurologist started her on inderal for blood pressure.  Review of Systems  Constitutional: Negative.   HENT: Negative.   Respiratory: Negative.   Cardiovascular: Negative.   Genitourinary: Negative.   Neurological: Negative.   Psychiatric/Behavioral: Negative.   All other systems reviewed and are negative.      Objective:   Physical Exam  Constitutional: She appears well-developed and well-nourished. No distress.  Cardiovascular: Normal rate, regular rhythm and normal heart sounds.   Pulmonary/Chest: Effort normal and breath sounds normal.  Abdominal: Soft. Bowel sounds are normal.  Neurological: She is alert.  Skin: Skin is warm.  Psychiatric: She has a normal mood and affect. Her behavior is  normal. Judgment and thought content normal.   BP 137/85   Pulse 86   Temp 97.2 F (36.2 C) (Oral)   Ht '5\' 7"'$  (1.702 m)   Wt 185 lb (83.9 kg)   BMI 28.98 kg/m        Assessment & Plan:  1. Bipolar I disorder, most recent episode depressed (Karen Serrano) [facility] wit [facility]  2. Opioid dependence in remission (Karen Serrano) Need to get back to saboxin clinic NA encouraged  3. Essential hypertension Do not add salt to diet Stopped propranolol and changed to lisinopril - lisinopril (PRINIVIL,ZESTRIL) 10 MG tablet; Take 1 tablet (10 mg total) by mouth daily.  Dispense: 90 tablet; Refill: 3 - CMP14+EGFR - Lipid panel  4. BMI 28.0-28.9,adult Discussed diet and exercise for person with BMI >25 Will recheck weight in 3-6 months      Labs pending Health maintenance reviewed Diet and exercise encouraged Continue all meds Follow up  In 3 months   [place], FNP

## 2016-03-17 NOTE — Telephone Encounter (Signed)
Patient was seen on 03/06/16

## 2016-04-21 ENCOUNTER — Ambulatory Visit (INDEPENDENT_AMBULATORY_CARE_PROVIDER_SITE_OTHER): Payer: Medicare Other

## 2016-04-21 DIAGNOSIS — Z23 Encounter for immunization: Secondary | ICD-10-CM | POA: Diagnosis not present

## 2016-09-30 ENCOUNTER — Encounter: Payer: Medicare Other | Admitting: *Deleted

## 2016-10-12 ENCOUNTER — Encounter: Payer: Medicare Other | Admitting: *Deleted

## 2017-01-27 ENCOUNTER — Encounter: Payer: Medicare Other | Admitting: *Deleted

## 2017-04-01 ENCOUNTER — Encounter: Payer: Self-pay | Admitting: Nurse Practitioner

## 2017-04-01 ENCOUNTER — Ambulatory Visit (INDEPENDENT_AMBULATORY_CARE_PROVIDER_SITE_OTHER): Payer: Medicare Other | Admitting: Nurse Practitioner

## 2017-04-01 VITALS — BP 138/91 | HR 82 | Temp 97.0°F | Ht 67.0 in | Wt 179.0 lb

## 2017-04-01 DIAGNOSIS — M5431 Sciatica, right side: Secondary | ICD-10-CM | POA: Diagnosis not present

## 2017-04-01 DIAGNOSIS — M79604 Pain in right leg: Secondary | ICD-10-CM

## 2017-04-01 MED ORDER — METHYLPREDNISOLONE ACETATE 80 MG/ML IJ SUSP
80.0000 mg | Freq: Once | INTRAMUSCULAR | Status: AC
  Administered 2017-04-01: 80 mg via INTRAMUSCULAR

## 2017-04-01 NOTE — Progress Notes (Signed)
   Subjective:    Patient ID: Karen MaesMelody A Serrano, female    DOB: 01-25-1966, 51 y.o.   MRN: [id number]  HPI Patient in the office with complaints of right leg pain that has been occurring for a few years, but has gotten worse in the last week.  She is thinking this could be bursitis as she has this in her shoulder occasionally.  Patient has started a new part-time job where she is standing more than previously and this is when she started noticing the pain in her leg. Pain starts at right buttocks and can move around from joint to joint.  [facility] this with some relief.     Review of Systems  Constitutional: Negative for fatigue and fever.  Musculoskeletal: Positive for arthralgias (right buttocks, hip, and knee) and gait problem. Negative for back pain and joint swelling.  Neurological: Negative for dizziness and light-headedness.  All other systems reviewed and are negative.      Objective:   Physical Exam  Constitutional: She is oriented to person, place, and time. She appears well-developed and well-nourished. No distress.  HENT:  Head: Normocephalic.  Cardiovascular: Normal rate and regular rhythm.   Pulmonary/Chest: Effort normal and breath sounds normal. No respiratory distress.  Musculoskeletal: Normal range of motion. She exhibits no edema or tenderness.  From OF LUMBAR SPINE WITHOUT PAIN (-) slr BIL MOTOR STRENGTH AND SENSATION DISTALLY INTACT  Neurological: She is alert and oriented to person, place, and time.  Skin: Skin is warm.  Psychiatric: Her mood appears anxious.  shaky   BP (!) 138/91   Pulse 82   Temp (!) 97 F (36.1 C) (Oral)   Ht 5\' 7"  (1.702 m)   Wt 179 lb (81.2 kg)   SpO2 98%   BMI 28.04 kg/m     Assessment & Plan:  1. Right leg pain  2. Sciatica of right side Alternate sitting and standing Stretching exercises discussed Motrin or tylenol OTC as needed Moist heat to area BID RTO prn - methylPREDNISolone acetate  (DEPO-MEDROL) injection 80 mg; Inject 1 mL (80 mg total) into the muscle once.  Karen Daphine DeutscherMartin, FNP

## 2017-04-01 NOTE — Patient Instructions (Signed)
Sciatica Sciatica is pain, numbness, weakness, or tingling along your sciatic nerve. The sciatic nerve starts in the lower back and goes down the back of each leg. Sciatica happens when this nerve is pinched or has pressure put on it. Sciatica usually goes away on its own or with treatment. Sometimes, sciatica may keep coming back (recur). Follow these instructions at home: Medicines  Take over-the-counter and prescription medicines only as told by your doctor.  Do not drive or use heavy machinery while taking prescription pain medicine. Managing pain  If directed, put ice on the affected area. ? Put ice in a plastic bag. ? Place a towel between your skin and the bag. ? Leave the ice on for 20 minutes, 2-3 times a day.  After icing, apply heat to the affected area before you exercise or as often as told by your doctor. Use the heat source that your doctor tells you to use, such as a moist heat pack or a heating pad. ? Place a towel between your skin and the heat source. ? Leave the heat on for 20-30 minutes. ? Remove the heat if your skin turns bright red. This is especially important if you are unable to feel pain, heat, or cold. You may have a greater risk of getting burned. Activity  Return to your normal activities as told by your doctor. Ask your doctor what activities are safe for you. ? Avoid activities that make your sciatica worse.  Take short rests during the day. Rest in a lying or standing position. This is usually better than sitting to rest. ? When you rest for a long time, do some physical activity or stretching between periods of rest. ? Avoid sitting for a long time without moving. Get up and move around at least one time each hour.  Exercise and stretch regularly, as told by your doctor.  Do not lift anything that is heavier than 10 lb (4.5 kg) while you have symptoms of sciatica. ? Avoid lifting heavy things even when you do not have symptoms. ? Avoid lifting heavy  things over and over.  When you lift objects, always lift in a way that is safe for your body. To do this, you should: ? Bend your knees. ? Keep the object close to your body. ? Avoid twisting. General instructions  Use good posture. ? Avoid leaning forward when you are sitting. ? Avoid hunching over when you are standing.  Stay at a healthy weight.  Wear comfortable shoes that support your feet. Avoid wearing high heels.  Avoid sleeping on a mattress that is too soft or too hard. You might have less pain if you sleep on a mattress that is firm enough to support your back.  Keep all follow-up visits as told by your doctor. This is important. Contact a doctor if:  You have pain that: ? Wakes you up when you are sleeping. ? Gets worse when you lie down. ? Is worse than the pain you have had in the past. ? Lasts longer than 4 weeks.  You lose weight for without trying. Get help right away if:  You cannot control when you pee (urinate) or poop (have a bowel movement).  You have weakness in any of these areas and it gets worse. ? Lower back. ? Lower belly (pelvis). ? Butt (buttocks). ? Legs.  You have redness or swelling of your back.  You have a burning feeling when you pee. This information is not intended to replace   advice given to you by your health care provider. Make sure you discuss any questions you have with your health care provider. Document Released: 04/28/2008 Document Revised: 12/26/2015 Document Reviewed: 03/29/2015 Elsevier Interactive Patient Education  2018 Elsevier Inc.  

## 2017-04-15 ENCOUNTER — Encounter: Payer: Self-pay | Admitting: Family Medicine

## 2017-04-15 ENCOUNTER — Ambulatory Visit (INDEPENDENT_AMBULATORY_CARE_PROVIDER_SITE_OTHER): Payer: Medicare Other

## 2017-04-15 ENCOUNTER — Ambulatory Visit (INDEPENDENT_AMBULATORY_CARE_PROVIDER_SITE_OTHER): Payer: Medicare Other | Admitting: Family Medicine

## 2017-04-15 VITALS — BP 139/86 | HR 103 | Temp 97.6°F | Ht 67.0 in | Wt 174.4 lb

## 2017-04-15 DIAGNOSIS — M25521 Pain in right elbow: Secondary | ICD-10-CM | POA: Diagnosis not present

## 2017-04-15 DIAGNOSIS — M79631 Pain in right forearm: Secondary | ICD-10-CM | POA: Diagnosis not present

## 2017-04-15 DIAGNOSIS — M79621 Pain in right upper arm: Secondary | ICD-10-CM

## 2017-04-15 DIAGNOSIS — W19XXXA Unspecified fall, initial encounter: Secondary | ICD-10-CM

## 2017-04-15 NOTE — Progress Notes (Signed)
   HPI  Patient presents today here with pain and bruising the right arm.  Patient states that she tripped over a cord in her hallway at home when she fell on an outstretched right arm making contact with her right medial elbow. She also has a small abrasion on her right knee. She states that she has full use of the elbow and arm, although it does hurt at times. She's concerned because extensive bruising and swelling.  We discussed her depression, she has [facility]. She has passive suicidal thoughts on a daily basis but states that she would never hurt herself and has multiple reasons why she would not. She has good medication compliance. From a depression standpoint she feels that she is doing normally.  She has tried 400 mg of ibuprofen 3 times daily for pain which is helping well. She has not tried ice  No Headache or head trauma reported  PMH: Smoking status noted ROS: Per HPI  Objective: BP 139/86   Pulse (!) 103   Temp 97.6 F (36.4 C) (Oral)   Ht 5\' 7"  (1.702 m)   Wt 174 lb 6.4 oz (79.1 kg)   BMI 27.31 kg/m  Gen: NAD, alert, cooperative with exam HEENT: NCAT CV: RRR, good S1/S2, no murmur Resp: CTABL, no wheezes, non-labored Ext: No edema, warm Neuro: Alert and oriented, No gross deficits Msk: Mild tenderness to palpation of the right medial epicondyles, no pain with supination or pronation of the hand, the patient with flexion to 20 or 30 on the right elbow  Plain film: Right elbow, humerus, radius, and ulna with no clear fractures or acute changes  Assessment and plan:  # Right elbow pain No clear fracture Discussed supportive care, discussed NSAID dosing Discussed ice. Return to clinic as needed, radiology read is pending    Orders Placed This Encounter  Procedures  . DG Humerus Right    Standing Status:   Future    Number of Occurrences:   1    Standing Expiration Date:   06/15/2018    Order Specific Question:   Reason for Exam  (SYMPTOM  OR DIAGNOSIS REQUIRED)    Answer:   fall    Order Specific Question:   Is patient pregnant?    Answer:   No    Order Specific Question:   Preferred imaging location?    Answer:   Internal    Order Specific Question:   Radiology Contrast Protocol - do NOT remove file path    Answer:   \\[email or web address]s.pdf  . DG Forearm Right    Standing Status:   Future    Number of Occurrences:   1    Standing Expiration Date:   06/15/2018    Order Specific Question:   Reason for Exam (SYMPTOM  OR DIAGNOSIS REQUIRED)    Answer:   fall    Order Specific Question:   Is patient pregnant?    Answer:   No    Order Specific Question:   Preferred imaging location?    Answer:   Internal    Order Specific Question:   Radiology Contrast Protocol - do NOT remove file path    Answer:   \\[email or web address]    Murtis SinkSam Ayrianna Mcginniss, MD [facility] 04/15/2017, 2:58 PM

## 2017-05-06 ENCOUNTER — Other Ambulatory Visit: Payer: Self-pay | Admitting: Family Medicine

## 2017-05-06 DIAGNOSIS — I10 Essential (primary) hypertension: Secondary | ICD-10-CM

## 2017-05-12 ENCOUNTER — Ambulatory Visit (INDEPENDENT_AMBULATORY_CARE_PROVIDER_SITE_OTHER): Payer: Medicare Other | Admitting: Family Medicine

## 2017-05-12 ENCOUNTER — Encounter: Payer: Self-pay | Admitting: Family Medicine

## 2017-05-12 VITALS — BP 124/82 | HR 123 | Temp 100.4°F | Ht 67.0 in | Wt 177.0 lb

## 2017-05-12 DIAGNOSIS — N3001 Acute cystitis with hematuria: Secondary | ICD-10-CM

## 2017-05-12 LAB — URINALYSIS, COMPLETE
Bilirubin, UA: NEGATIVE
GLUCOSE, UA: NEGATIVE
Nitrite, UA: NEGATIVE
Specific Gravity, UA: >1.03 — ABNORMAL HIGH (ref 1.005–1.030)
Urobilinogen, Ur: 1 mg/dL (ref 0.2–1.0)
pH, UA: 6.5 (ref 5.0–7.5)

## 2017-05-12 LAB — MICROSCOPIC EXAMINATION
Renal Epithel, UA: NONE SEEN /hpf
WBC, UA: >30 /hpf — AB (ref 0–?)

## 2017-05-12 MED ORDER — CEPHALEXIN 500 MG PO CAPS: 500.0000 mg | ORAL_CAPSULE | Freq: Four times a day (QID) | ORAL | 0 refills | Status: DC

## 2017-05-12 MED ORDER — CEFTRIAXONE SODIUM 1 G IJ SOLR
1.0000 g | Freq: Once | INTRAMUSCULAR | Status: AC
  Administered 2017-05-12: 1 g via INTRAMUSCULAR

## 2017-05-12 NOTE — Patient Instructions (Signed)
Your urinalysis today suggest that you have a urinary tract infection. I sent this for culture. If your culture grows any bacteria that is not responsive to the antibiotic I put you on, I will call you to change his antibiotic. You received 1 dose of Rocephin injection. You also being prescribed cephalexin to take 1 capsule 4 times a day. Take first dose after lunchtime. If your symptoms worsen, you have persistent fever after 2 days on antibiotics, you develop gross blood in your urine, or severe abdominal pain, please seek immediate medical attention.   Urinary Tract Infection, Adult A urinary tract infection (UTI) is an infection of any part of the urinary tract, which includes the kidneys, ureters, bladder, and urethra. These organs make, store, and get rid of urine in the body. UTI can be a bladder infection (cystitis) or kidney infection (pyelonephritis). What are the causes? This infection may be caused by fungi, viruses, or bacteria. Bacteria are the most common cause of UTIs. This condition can also be caused by repeated incomplete emptying of the bladder during urination. What increases the risk? This condition is more likely to develop if:  You ignore your need to urinate or hold urine for long periods of time.  You do not empty your bladder completely during urination.  You wipe back to front after urinating or having a bowel movement, if you are female.  You are uncircumcised, if you are female.  You are constipated.  You have a urinary catheter that stays in place (indwelling).  You have a weak defense (immune) system.  You have a medical condition that affects your bowels, kidneys, or bladder.  You have diabetes.  You take antibiotic medicines frequently or for long periods of time, and the antibiotics no longer work well against certain types of infections (antibiotic resistance).  You take medicines that irritate your urinary tract.  You are exposed to chemicals that  irritate your urinary tract.  You are female.  What are the signs or symptoms? Symptoms of this condition include:  Fever.  Frequent urination or passing small amounts of urine frequently.  Needing to urinate urgently.  Pain or burning with urination.  Urine that smells bad or unusual.  Cloudy urine.  Pain in the lower abdomen or back.  Trouble urinating.  Blood in the urine.  Vomiting or being less hungry than normal.  Diarrhea or abdominal pain.  Vaginal discharge, if you are female.  How is this diagnosed? This condition is diagnosed with a medical history and physical exam. You will also need to provide a urine sample to test your urine. Other tests may be done, including:  Blood tests.  Sexually transmitted disease (STD) testing.  If you have had more than one UTI, a cystoscopy or imaging studies may be done to determine the cause of the infections. How is this treated? Treatment for this condition often includes a combination of two or more of the following:  Antibiotic medicine.  Other medicines to treat less common causes of UTI.  Over-the-counter medicines to treat pain.  Drinking enough water to stay hydrated.  Follow these instructions at home:  Take over-the-counter and prescription medicines only as told by your health care provider.  If you were prescribed an antibiotic, take it as told by your health care provider. Do not stop taking the antibiotic even if you start to feel better.  Avoid alcohol, caffeine, tea, and carbonated beverages. They can irritate your bladder.  Drink enough fluid to keep your  urine clear or pale yellow.  Keep all follow-up visits as told by your health care provider. This is important.  Make sure to: ? Empty your bladder often and completely. Do not hold urine for long periods of time. ? Empty your bladder before and after sex. ? Wipe from front to back after a bowel movement if you are female. Use each tissue  one time when you wipe. Contact a health care provider if:  You have back pain.  You have a fever.  You feel nauseous or vomit.  Your symptoms do not get better after 3 days.  Your symptoms go away and then return. Get help right away if:  You have severe back pain or lower abdominal pain.  You are vomiting and cannot keep down any medicines or water. This information is not intended to replace advice given to you by your health care provider. Make sure you discuss any questions you have with your health care provider. Document Released: 04/29/2005 Document Revised: 01/01/2016 Document Reviewed: 06/10/2015 Elsevier Interactive Patient Education  2017 [employer].

## 2017-05-12 NOTE — Addendum Note (Signed)
Addended byMaryfrances Bunnell, Ewa Hipp C on: 05/12/2017 10:30 AM   Modules accepted: Orders

## 2017-05-12 NOTE — Progress Notes (Signed)
   Subjective: WU:[id number] symptoms PCP: Bennie Pierini, FNP NWG:[id number] Karen Serrano is Karen 51 y.o. female presenting to clinic today for:  1. Urinary symptoms Patient reports increased urinary frequency and urgency since Sunday. She notes that she recently engaged in intercourse with her boyfriend after having been abstinent for some time. She denies hematuria, dysuria, abdominal pain, back pain, nausea, vomiting, rash, vaginal discharge. She denies fevers at home. She denies history of recurrent urinary tract infections. She has taken no medications for her symptoms.  Allergies  Allergen Reactions  . Sulfa Antibiotics Other (See Comments)    Unknown   Past Medical History:  Diagnosis Date  . Depression   . Hypertension    Family History  Problem Relation Age of Onset  . Cancer Mother    Social Hx: daily smoker.Current medications reviewed.   ROS: Per HPI  Objective: Office vital signs reviewed. BP 124/82   Pulse (!) 123   Temp (!) 100.4 F (38 C) (Oral)   Ht  (1.702 m)   Wt 177 lb (80.3 kg)   BMI 27.72 kg/m   Physical Examination:  General: Awake, alert, nontoxic appearing, No acute distress Cardio: increased rate and normal rhythm, S1S2 heard, no murmurs appreciated Pulm: clear to auscultation bilaterally, no wheezes, rhonchi or rales; normal work of breathing on room air GU:no suprapubic tenderness, no CVA TTP Psych: patient somewhat anxious  No results found for this or any previous visit (from the past 24 hour(s)).  Assessment/ Plan: 51 y.o. female   1. Acute cystitis with hematuria Urinalysis with 1+ blood, 2+ protein, 2+ leukocytes. Urine microscopy with greater than 30 white blood cells, 11-30 red blood cells, moderate bacteria present. This was sent for urine culture. She does have Karen true fever of 100.64F and elevated pulse on exam. During my exam pulse was roughly about 110. She is nontoxic-appearing and demonstrates no physical exam findings  consistent with pyelonephritis. However, in the setting of vital sign abnormalities, I will treat her to cover for possible pyelonephritis. She was given 1 dose of Rocephin 1 mg IM here in office. She is also being prescribed Keflex 500 mg to take 4 times daily for the next 10 days. Strict return precautions reviewed with the patient. She voiced good understanding. She'll follow up as needed. - Urinalysis, Complete - Urine Culture - cephALEXin (KEFLEX) 500 MG capsule; Take 1 capsule (500 mg total) by mouth 4 (four) times daily.  Dispense: 40 capsule; Refill: 0   Orders Placed This Encounter  Procedures  . Urine Culture  . Urinalysis, Complete   No orders of the defined types were placed in this encounter.    Raliegh Ip, DO [facility] [phone]

## 2017-05-15 LAB — URINE CULTURE

## 2018-06-20 ENCOUNTER — Telehealth: Payer: Self-pay | Admitting: Nurse Practitioner

## 2018-06-20 NOTE — Telephone Encounter (Signed)
Ok to schedule.

## 2018-06-20 NOTE — Telephone Encounter (Signed)
Is this ok to schedule?

## 2018-06-21 NOTE — Telephone Encounter (Signed)
Pt will come in on 12/12 @ 2 to see MMM

## 2018-07-14 ENCOUNTER — Encounter: Payer: Self-pay | Admitting: Nurse Practitioner

## 2018-07-14 ENCOUNTER — Ambulatory Visit: Payer: Medicare Other | Admitting: Nurse Practitioner

## 2018-07-14 VITALS — BP 124/72 | HR 81 | Temp 97.0°F | Ht 67.0 in | Wt 167.0 lb

## 2018-07-14 DIAGNOSIS — F3130 Bipolar disorder, current episode depressed, mild or moderate severity, unspecified: Secondary | ICD-10-CM

## 2018-07-14 MED ORDER — CITALOPRAM HYDROBROMIDE 40 MG PO TABS: 40.0000 mg | ORAL_TABLET | Freq: Every day | ORAL | 1 refills | Status: DC

## 2018-07-14 NOTE — Progress Notes (Signed)
Subjective:    Patient ID: Karen MaesMelody A Bayer, female    DOB: 09-11-1965, 52 y.o.   MRN: [id number]   Chief Complaint: Refill medications   HPI Patient has a long history of bipolar disorder. She is currently on celexa 40mg  daily. [place] care of her mother in law. She has not found anyone to see there. She just needs her celexa refilled. [facility] screen [facility] 2/9 07/14/2018 05/12/2017 04/15/2017  Decreased Interest 1 0 3  Down, Depressed, Hopeless 1 0 3  PHQ - 2 Score 2 0 6  Altered sleeping 1 - 3  Tired, decreased energy 1 - 3  Change in appetite 1 - 3  Feeling bad or failure about yourself  1 - 3  Trouble concentrating 1 - 3  Moving slowly or fidgety/restless 1 - 3  Suicidal thoughts 0 - 3  PHQ-9 Score 8 - 27  Difficult doing work/chores - - -      Review of Systems  Constitutional: Negative.   HENT: Negative.   Respiratory: Negative.   Cardiovascular: Negative.   Gastrointestinal: Negative.   Genitourinary: Negative.   Neurological: Negative.   Psychiatric/Behavioral: Negative.   All other systems reviewed and are negative.      Objective:   Physical Exam Vitals signs and nursing note reviewed.  Constitutional:      General: She is not in acute distress.    Appearance: Normal appearance. She is well-developed.  HENT:     Head: Normocephalic.     Nose: Nose normal.  Eyes:     Pupils: Pupils are equal, round, and reactive to light.  Neck:     Musculoskeletal: Normal range of motion and neck supple.     Vascular: No carotid bruit or JVD.  Cardiovascular:     Rate and Rhythm: Normal rate and regular rhythm.     Heart sounds: Normal heart sounds.  Pulmonary:     Effort: Pulmonary effort is normal. No respiratory distress.     Breath sounds: Normal breath sounds. No wheezing or rales.  Chest:     Chest wall: No tenderness.  Abdominal:     General: Bowel sounds are normal. There is no distension or abdominal bruit.   Palpations: Abdomen is soft. There is no hepatomegaly, splenomegaly, mass or pulsatile mass.     Tenderness: There is no abdominal tenderness.  Musculoskeletal: Normal range of motion.  Lymphadenopathy:     Cervical: No cervical adenopathy.  Skin:    General: Skin is warm and dry.  Neurological:     Mental Status: She is alert and oriented to person, place, and time.     Deep Tendon Reflexes: Reflexes are normal and symmetric.  Psychiatric:        Behavior: Behavior normal.        Thought Content: Thought content normal.        Judgment: Judgment normal.     BP 124/72   Pulse 81   Temp (!) 97 F (36.1 C) (Oral)   Ht 5\' 7"  (1.702 m)   Wt 167 lb (75.8 kg)   BMI 26.16 kg/m        Assessment & Plan:  Hessie A Thall in today with chief complaint of Refill medications   Glenyce A Holts in today with chief complaint of Refill medications   1. Bipolar I disorder, most recent episode depressed [facility](HCC) Stress management Need to find pcp in [place] RTO prn  Mary-Margaret Daphine DeutscherMartin, [place]NP

## 2018-07-14 NOTE — Patient Instructions (Signed)
Stress and Stress Management Stress is a normal reaction to life events. It is what you feel when life demands more than you are used to or more than you can handle. Some stress can be useful. For example, the stress reaction can help you catch the last bus of the day, study for a test, or meet a deadline at work. But stress that occurs too often or for too long can cause problems. It can affect your emotional health and interfere with relationships and normal daily activities. Too much stress can weaken your immune system and increase your risk for physical illness. If you already have a medical problem, stress can make it worse. What are the causes? All sorts of life events may cause stress. An event that causes stress for one person may not be stressful for another person. Major life events commonly cause stress. These may be positive or negative. Examples include losing your job, moving into a new home, getting married, having a baby, or losing a loved one. Less obvious life events may also cause stress, especially if they occur day after day or in combination. Examples include working long hours, driving in traffic, caring for children, being in debt, or being in a difficult relationship. What are the signs or symptoms? Stress may cause emotional symptoms including, the following:  Anxiety. This is feeling worried, afraid, on edge, overwhelmed, or out of control.  Anger. This is feeling irritated or impatient.  Depression. This is feeling sad, down, helpless, or guilty.  Difficulty focusing, remembering, or making decisions.  Stress may cause physical symptoms, including the following:  Aches and pains. These may affect your head, neck, back, stomach, or other areas of your body.  Tight muscles or clenched jaw.  Low energy or trouble sleeping.  Stress may cause unhealthy behaviors, including the following:  Eating to feel better (overeating) or skipping meals.  Sleeping too little,  too much, or both.  Working too much or putting off tasks (procrastination).  Smoking, drinking alcohol, or using drugs to feel better.  How is this diagnosed? Stress is diagnosed through an assessment by your health care provider. Your health care provider will ask questions about your symptoms and any stressful life events.Your health care provider will also ask about your medical history and may order blood tests or other tests. Certain medical conditions and medicine can cause physical symptoms similar to stress. Mental illness can cause emotional symptoms and unhealthy behaviors similar to stress. Your health care provider may refer you to a mental health professional for further evaluation. How is this treated? Stress management is the recommended treatment for stress.The goals of stress management are reducing stressful life events and coping with stress in healthy ways. Techniques for reducing stressful life events include the following:  Stress identification. Self-monitor for stress and identify what causes stress for you. These skills may help you to avoid some stressful events.  Time management. Set your priorities, keep a calendar of events, and learn to say "no." These tools can help you avoid making too many commitments.  Techniques for coping with stress include the following:  Rethinking the problem. Try to think realistically about stressful events rather than ignoring them or overreacting. Try to find the positives in a stressful situation rather than focusing on the negatives.  Exercise. Physical exercise can release both physical and emotional tension. The key is to find a form of exercise you enjoy and do it regularly.  Relaxation techniques. These relax the body and  mind. Examples include yoga, meditation, tai chi, biofeedback, deep breathing, progressive muscle relaxation, listening to music, being out in nature, journaling, and other hobbies. Again, the key is to find  one or more that you enjoy and can do regularly.  Healthy lifestyle. Eat a balanced diet, get plenty of sleep, and do not smoke. Avoid using alcohol or drugs to relax.  Strong support network. Spend time with family, friends, or other people you enjoy being around.Express your feelings and talk things over with someone you trust.  Counseling or talktherapy with a mental health professional may be helpful if you are having difficulty managing stress on your own. Medicine is typically not recommended for the treatment of stress.Talk to your health care provider if you think you need medicine for symptoms of stress. Follow these instructions at home:  Keep all follow-up visits as directed by your health care provider.  Take all medicines as directed by your health care provider. Contact a health care provider if:  Your symptoms get worse or you start having new symptoms.  You feel overwhelmed by your problems and can no longer manage them on your own. Get help right away if:  You feel like hurting yourself or someone else. This information is not intended to replace advice given to you by your health care provider. Make sure you discuss any questions you have with your health care provider. Document Released: 01/13/2001 Document Revised: 12/26/2015 Document Reviewed: 03/14/2013 Elsevier Interactive Patient Education  2017 [employer] Inc[employer]  

## 2018-12-08 ENCOUNTER — Ambulatory Visit (INDEPENDENT_AMBULATORY_CARE_PROVIDER_SITE_OTHER): Payer: Medicare Other | Admitting: Nurse Practitioner

## 2018-12-08 ENCOUNTER — Encounter: Payer: Self-pay | Admitting: Nurse Practitioner

## 2018-12-08 ENCOUNTER — Other Ambulatory Visit: Payer: Self-pay

## 2018-12-08 DIAGNOSIS — Z6828 Body mass index (BMI) 28.0-28.9, adult: Secondary | ICD-10-CM | POA: Diagnosis not present

## 2018-12-08 DIAGNOSIS — I10 Essential (primary) hypertension: Secondary | ICD-10-CM

## 2018-12-08 DIAGNOSIS — F1121 Opioid dependence, in remission: Secondary | ICD-10-CM

## 2018-12-08 DIAGNOSIS — E7800 Pure hypercholesterolemia, unspecified: Secondary | ICD-10-CM | POA: Diagnosis not present

## 2018-12-08 DIAGNOSIS — F3130 Bipolar disorder, current episode depressed, mild or moderate severity, unspecified: Secondary | ICD-10-CM

## 2018-12-08 MED ORDER — CITALOPRAM HYDROBROMIDE 40 MG PO TABS: 40.0000 mg | ORAL_TABLET | Freq: Every day | ORAL | 1 refills | Status: DC

## 2018-12-08 MED ORDER — ATORVASTATIN CALCIUM 80 MG PO TABS: 80.0000 mg | ORAL_TABLET | Freq: Every day | ORAL | 1 refills | Status: DC

## 2018-12-08 MED ORDER — LISINOPRIL 10 MG PO TABS: 10.0000 mg | ORAL_TABLET | Freq: Every day | ORAL | 1 refills | Status: DC

## 2018-12-08 NOTE — Progress Notes (Signed)
Virtual Visit via telephone Note  I connected with Karen Serrano on 12/08/18 at 2:00 PM by telephone and verified that I am speaking with the correct person using two identifiers. Bibi A Morini is currently located at home and no one is currently with her during visit. The provider, [name], FNP is located in their office at time of visit.  I discussed the limitations, risks, security and privacy concerns of performing an evaluation and management service by telephone and the availability of in person appointments. I also discussed with the patient that there may be a patient responsible charge related to this service. The patient expressed understanding and agreed to proceed.   History and Present Illness:   Chief Complaint: medical management of chronic issues   HPI:  1. Essential hypertension No c/o chest pain, sob or headache. Does not check blood pressure. BP Readings from Last 3 Encounters:  07/14/18 124/72  05/12/17 124/82  04/15/17 139/86     2. Bipolar I disorder, most recent episode depressed (HCC) Is on celexa right now and says she is doing okay. Mood swings seem to be doing okay. Depression screen [facility] 2/9 07/14/2018 05/12/2017 04/15/2017  Decreased Interest 1 0 3  Down, Depressed, Hopeless 1 0 3  PHQ - 2 Score 2 0 6  Altered sleeping 1 - 3  Tired, decreased energy 1 - 3  Change in appetite 1 - 3  Feeling bad or failure about yourself  1 - 3  Trouble concentrating 1 - 3  Moving slowly or fidgety/restless 1 - 3  Suicidal thoughts 0 - 3  PHQ-9 Score 8 - 27  Difficult doing work/chores - - -     3. Opioid dependence in remission [facility]) Has a history of this. Denies any current opoid use. She has stopped taking her valium. She is doing CBD gummy bears daily.  4. BMI 28.0-28.9,adult No recent weight changes  5.hyperlipidemia Is on lipitor daily but does not watch diet very closely    Outpatient Encounter Medications as of 12/08/2018   Medication Sig  . atorvastatin (LIPITOR) 80 MG tablet Take 80 mg by mouth daily.  . citalopram (CELEXA) 40 MG tablet Take 1 tablet (40 mg total) by mouth daily.  . diazepam (VALIUM) 5 MG tablet Take 5 mg by mouth every 6 (six) hours as needed for anxiety.  Marland Kitchen lisinopril (PRINIVIL,ZESTRIL) 10 MG tablet Take 1 Tablet by mouth once daily  . Multiple Vitamin (MULTIVITAMIN WITH MINERALS) TABS Take 1 tablet by mouth daily.      New complaints: None today  Social history:  Her and her husband live with her mother in law and are her caregiver     Review of Systems  Constitutional: Negative for diaphoresis and weight loss.  Eyes: Negative for blurred vision, double vision and pain.  Respiratory: Negative for shortness of breath.   Cardiovascular: Negative for chest pain, palpitations, orthopnea and leg swelling.  Gastrointestinal: Negative for abdominal pain.  Skin: Negative for rash.  Neurological: Negative for dizziness, sensory change, loss of consciousness, weakness and headaches.  Endo/Heme/Allergies: Negative for polydipsia. Does not bruise/bleed easily.  Psychiatric/Behavioral: Negative for memory loss. The patient does not have insomnia.   All other systems reviewed and are negative.    Observations/Objective: Alert and oriented- answers all questions appropriately No distressnoted  Assessment and Plan: Damoni A Lanza comes in today with chief complaint of No chief complaint on file.   Diagnosis and orders addressed:  1. Essential hypertension Low  sodium diet - lisinopril (ZESTRIL) 10 MG tablet; Take 1 tablet (10 mg total) by mouth daily.  Dispense: 90 tablet; Refill: 1  2. Bipolar I disorder, most recent episode depressed (HCC) Stress management - citalopram (CELEXA) 40 MG tablet; Take 1 tablet (40 mg total) by mouth daily.  Dispense: 90 tablet; Refill: 1  3. Opioid dependence in remission (HCC) Avoid narcotics  4. BMI 28.0-28.9,adult Discussed diet and  exercise for person with BMI >25 Will recheck weight in 3-6 months  5. Pure hypercholesterolemia Low fat diet - atorvastatin (LIPITOR) 80 MG tablet; Take 1 tablet (80 mg total) by mouth daily.  Dispense: 90 tablet; Refill: 1   Previous lab results reviewed Health Maintenance reviewed Diet and exercise encouraged    Follow Up Instructions: 3 months    I discussed the assessment and treatment plan with the patient. The patient was provided an opportunity to ask questions and all were answered. The patient agreed with the plan and demonstrated an understanding of the instructions.   The patient was advised to call back or seek an in-person evaluation if the symptoms worsen or if the condition fails to improve as anticipated.  The above assessment and management plan was discussed with the patient. The patient verbalized understanding of and has agreed to the management plan. Patient is aware to call the clinic if symptoms persist or worsen. Patient is aware when to return to the clinic for a follow-up visit. Patient educated on when it is appropriate to go to the emergency department.   Time call ended:  2:17  I provided 20 minutes of non-face-to-face time during this encounter.    Mary-Margaret Daphine DeutscherMartin, FNP

## 2018-12-13 ENCOUNTER — Telehealth: Payer: Self-pay | Admitting: Nurse Practitioner

## 2018-12-13 ENCOUNTER — Other Ambulatory Visit: Payer: Self-pay | Admitting: Nurse Practitioner

## 2018-12-13 DIAGNOSIS — I10 Essential (primary) hypertension: Secondary | ICD-10-CM

## 2018-12-13 DIAGNOSIS — E7800 Pure hypercholesterolemia, unspecified: Secondary | ICD-10-CM

## 2018-12-13 MED ORDER — LISINOPRIL 10 MG PO TABS: 10.0000 mg | ORAL_TABLET | Freq: Every day | ORAL | 1 refills | Status: DC

## 2018-12-13 MED ORDER — ATORVASTATIN CALCIUM 80 MG PO TABS: 80.0000 mg | ORAL_TABLET | Freq: Every day | ORAL | 1 refills | Status: DC

## 2018-12-13 NOTE — Telephone Encounter (Signed)
Medication sent to [facility].

## 2018-12-15 ENCOUNTER — Other Ambulatory Visit: Payer: Self-pay

## 2018-12-15 NOTE — Patient Outreach (Signed)
[facility]) Care Management  12/15/2018  Chrisma A Daino [birth year] [id number]   Medication Adherence call to Mrs. Yaslin Henne Hippa Identifiers Verify spoke with patient she is due on Lisinopril 10 mg and Atorvastatin 80 mg patient explain doctor has send the prescription to a different pharmacy and it was taking a bit to fix it to be send to the right pharmacy patient will pick up today she also said she is going to start reciving it thru mail order [facility]  and has already set up an account with them. Mrs. Tyra is showing past due under [facility].     Lillia Abed CPhT Pharmacy Technician [facility] Management Direct Dial [phone]  Fax [phone] [email or web address]

## 2019-01-24 ENCOUNTER — Other Ambulatory Visit: Payer: Self-pay | Admitting: Nurse Practitioner

## 2019-01-24 DIAGNOSIS — F3130 Bipolar disorder, current episode depressed, mild or moderate severity, unspecified: Secondary | ICD-10-CM

## 2019-03-08 ENCOUNTER — Encounter: Payer: Self-pay | Admitting: Physician Assistant

## 2019-03-08 ENCOUNTER — Ambulatory Visit (INDEPENDENT_AMBULATORY_CARE_PROVIDER_SITE_OTHER): Payer: Medicare Other | Admitting: Physician Assistant

## 2019-03-08 ENCOUNTER — Other Ambulatory Visit: Payer: Self-pay

## 2019-03-08 VITALS — BP 104/69 | HR 108 | Temp 96.9°F | Ht 67.0 in | Wt 163.8 lb

## 2019-03-08 DIAGNOSIS — M7552 Bursitis of left shoulder: Secondary | ICD-10-CM | POA: Diagnosis not present

## 2019-03-08 MED ORDER — MELOXICAM 7.5 MG PO TABS: 7.5000 mg | ORAL_TABLET | Freq: Every day | ORAL | 1 refills | Status: DC

## 2019-03-08 NOTE — Patient Instructions (Signed)
Shoulder Exercises Ask your health care provider which exercises are safe for you. Do exercises exactly as told by your health care provider and adjust them as directed. It is normal to feel mild stretching, pulling, tightness, or discomfort as you do these exercises. Stop right away if you feel sudden pain or your pain gets worse. Do not begin these exercises until told by your health care provider. Stretching exercises External rotation and abduction This exercise is sometimes called corner stretch. This exercise rotates your arm outward (external rotation) and moves your arm out from your body (abduction). 1. Stand in a doorway with one of your feet slightly in front of the other. This is called a staggered stance. If you cannot reach your forearms to the door frame, stand facing a corner of a room. 2. Choose one of the following positions as told by your health care provider: ? Place your hands and forearms on the door frame above your head. ? Place your hands and forearms on the door frame at the height of your head. ? Place your hands on the door frame at the height of your elbows. 3. Slowly move your weight onto your front foot until you feel a stretch across your chest and in the front of your shoulders. Keep your head and chest upright and keep your abdominal muscles tight. 4. Hold for __________ seconds. 5. To release the stretch, shift your weight to your back foot. Repeat __________ times. Complete this exercise __________ times a day. Extension, standing 1. Stand and hold a broomstick, a cane, or a similar object behind your back. ? Your hands should be a little wider than shoulder width apart. ? Your palms should face away from your back. 2. Keeping your elbows straight and your shoulder muscles relaxed, move the stick away from your body until you feel a stretch in your shoulders (extension). ? Avoid shrugging your shoulders while you move the stick. Keep your shoulder blades tucked  down toward the middle of your back. 3. Hold for __________ seconds. 4. Slowly return to the starting position. Repeat __________ times. Complete this exercise __________ times a day. Range-of-motion exercises Pendulum  1. Stand near a wall or a surface that you can hold onto for balance. 2. Bend at the waist and let your left / right arm hang straight down. Use your other arm to support you. Keep your back straight and do not lock your knees. 3. Relax your left / right arm and shoulder muscles, and move your hips and your trunk so your left / right arm swings freely. Your arm should swing because of the motion of your body, not because you are using your arm or shoulder muscles. 4. Keep moving your hips and trunk so your arm swings in the following directions, as told by your health care provider: ? Side to side. ? Forward and backward. ? In clockwise and counterclockwise circles. 5. Continue each motion for __________ seconds, or for as long as told by your health care provider. 6. Slowly return to the starting position. Repeat __________ times. Complete this exercise __________ times a day. Shoulder flexion, standing  1. Stand and hold a broomstick, a cane, or a similar object. Place your hands a little more than shoulder width apart on the object. Your left / right hand should be palm up, and your other hand should be palm down. 2. Keep your elbow straight and your shoulder muscles relaxed. Push the stick up with your healthy arm to   raise your left / right arm in front of your body, and then over your head until you feel a stretch in your shoulder (flexion). ? Avoid shrugging your shoulder while you raise your arm. Keep your shoulder blade tucked down toward the middle of your back. 3. Hold for __________ seconds. 4. Slowly return to the starting position. Repeat __________ times. Complete this exercise __________ times a day. Shoulder abduction, standing 1. Stand and hold a broomstick,  a cane, or a similar object. Place your hands a little more than shoulder width apart on the object. Your left / right hand should be palm up, and your other hand should be palm down. 2. Keep your elbow straight and your shoulder muscles relaxed. Push the object across your body toward your left / right side. Raise your left / right arm to the side of your body (abduction) until you feel a stretch in your shoulder. ? Do not raise your arm above shoulder height unless your health care provider tells you to do that. ? If directed, raise your arm over your head. ? Avoid shrugging your shoulder while you raise your arm. Keep your shoulder blade tucked down toward the middle of your back. 3. Hold for __________ seconds. 4. Slowly return to the starting position. Repeat __________ times. Complete this exercise __________ times a day. Internal rotation  1. Place your left / right hand behind your back, palm up. 2. Use your other hand to dangle an exercise band, a towel, or a similar object over your shoulder. Grasp the band with your left / right hand so you are holding on to both ends. 3. Gently pull up on the band until you feel a stretch in the front of your left / right shoulder. The movement of your arm toward the center of your body is called internal rotation. ? Avoid shrugging your shoulder while you raise your arm. Keep your shoulder blade tucked down toward the middle of your back. 4. Hold for __________ seconds. 5. Release the stretch by letting go of the band and lowering your hands. Repeat __________ times. Complete this exercise __________ times a day. Strengthening exercises External rotation  1. Sit in a stable chair without armrests. 2. Secure an exercise band to a stable object at elbow height on your left / right side. 3. Place a soft object, such as a folded towel or a small pillow, between your left / right upper arm and your body to move your elbow about 4 inches (10 cm) away  from your side. 4. Hold the end of the exercise band so it is tight and there is no slack. 5. Keeping your elbow pressed against the soft object, slowly move your forearm out, away from your abdomen (external rotation). Keep your body steady so only your forearm moves. 6. Hold for __________ seconds. 7. Slowly return to the starting position. Repeat __________ times. Complete this exercise __________ times a day. Shoulder abduction  1. Sit in a stable chair without armrests, or stand up. 2. Hold a __________ weight in your left / right hand, or hold an exercise band with both hands. 3. Start with your arms straight down and your left / right palm facing in, toward your body. 4. Slowly lift your left / right hand out to your side (abduction). Do not lift your hand above shoulder height unless your health care provider tells you that this is safe. ? Keep your arms straight. ? Avoid shrugging your shoulder while you   do this movement. Keep your shoulder blade tucked down toward the middle of your back. 5. Hold for __________ seconds. 6. Slowly lower your arm, and return to the starting position. Repeat __________ times. Complete this exercise __________ times a day. Shoulder extension 1. Sit in a stable chair without armrests, or stand up. 2. Secure an exercise band to a stable object in front of you so it is at shoulder height. 3. Hold one end of the exercise band in each hand. Your palms should face each other. 4. Straighten your elbows and lift your hands up to shoulder height. 5. Step back, away from the secured end of the exercise band, until the band is tight and there is no slack. 6. Squeeze your shoulder blades together as you pull your hands down to the sides of your thighs (extension). Stop when your hands are straight down by your sides. Do not let your hands go behind your body. 7. Hold for __________ seconds. 8. Slowly return to the starting position. Repeat __________ times.  Complete this exercise __________ times a day. Shoulder row 1. Sit in a stable chair without armrests, or stand up. 2. Secure an exercise band to a stable object in front of you so it is at waist height. 3. Hold one end of the exercise band in each hand. Position your palms so that your thumbs are facing the ceiling (neutral position). 4. Bend each of your elbows to a 90-degree angle (right angle) and keep your upper arms at your sides. 5. Step back until the band is tight and there is no slack. 6. Slowly pull your elbows back behind you. 7. Hold for __________ seconds. 8. Slowly return to the starting position. Repeat __________ times. Complete this exercise __________ times a day. Shoulder press-ups  1. Sit in a stable chair that has armrests. Sit upright, with your feet flat on the floor. 2. Put your hands on the armrests so your elbows are bent and your fingers are pointing forward. Your hands should be about even with the sides of your body. 3. Push down on the armrests and use your arms to lift yourself off the chair. Straighten your elbows and lift yourself up as much as you comfortably can. ? Move your shoulder blades down, and avoid letting your shoulders move up toward your ears. ? Keep your feet on the ground. As you get stronger, your feet should support less of your body weight as you lift yourself up. 4. Hold for __________ seconds. 5. Slowly lower yourself back into the chair. Repeat __________ times. Complete this exercise __________ times a day. Wall push-ups  1. Stand so you are facing a stable wall. Your feet should be about one arm-length away from the wall. 2. Lean forward and place your palms on the wall at shoulder height. 3. Keep your feet flat on the floor as you bend your elbows and lean forward toward the wall. 4. Hold for __________ seconds. 5. Straighten your elbows to push yourself back to the starting position. Repeat __________ times. Complete this exercise  __________ times a day. This information is not intended to replace advice given to you by your health care provider. Make sure you discuss any questions you have with your health care provider. Document Released: 06/03/2005 Document Revised: 11/11/2018 Document Reviewed: 08/19/2018 Elsevier Patient Education  2020 Elsevier Inc.  

## 2019-03-12 NOTE — Progress Notes (Signed)
BP 104/69   Pulse (!) 108   Temp (!) 96.9 F (36.1 C) (Temporal)   Ht 5\' 7"  (1.702 m)   Wt 163 lb 12.8 oz (74.3 kg)   BMI 25.65 kg/m    Subjective:    Patient ID: Karen Serrano, female    DOB: 10-Jul-1966, 53 y.o.   MRN: [id number]  HPI: Karen Serrano is a 53 y.o. female presenting on 03/08/2019 for Shoulder Pain (left)  Patient has had increasing pain in her left shoulder.  It has bothered her mainly whenever she lifts the shoulder to the front on the side.  She does not have any specific major injury.  However she has used her arms her shoulder over her head a lot.  She does not feel any popping or cracking.  There is no laxity in the shoulder.  Past Medical History:  Diagnosis Date  . Depression   . Hypertension    Relevant past medical, surgical, family and social history reviewed and updated as indicated. Interim medical history since our last visit reviewed. Allergies and medications reviewed and updated. DATA REVIEWED: CHART IN EPIC  Family History reviewed for pertinent findings.  Review of Systems  Constitutional: Negative.   HENT: Negative.   Eyes: Negative.   Respiratory: Negative.   Gastrointestinal: Negative.   Genitourinary: Negative.   Musculoskeletal: Positive for arthralgias and myalgias.    Allergies as of 03/08/2019      Reactions   Sulfa Antibiotics Other (See Comments)   Unknown      Medication List       Accurate as of March 08, 2019 11:59 PM. If you have any questions, ask your nurse or doctor.        atorvastatin 80 MG tablet Commonly known as: LIPITOR Take 1 tablet (80 mg total) by mouth daily.   citalopram 40 MG tablet Commonly known as: CELEXA TAKE 1 TABLET BY MOUTH EVERY DAY   lisinopril 10 MG tablet Commonly known as: ZESTRIL Take 1 tablet (10 mg total) by mouth daily.   meloxicam 7.5 MG tablet Commonly known as: MOBIC Take 1 tablet (7.5 mg total) by mouth daily. Started by: Terald Sleeper, PA-C   multivitamin with  minerals Tabs tablet Take 1 tablet by mouth daily.          Objective:    BP 104/69   Pulse (!) 108   Temp (!) 96.9 F (36.1 C) (Temporal)   Ht 5\' 7"  (1.702 m)   Wt 163 lb 12.8 oz (74.3 kg)   BMI 25.65 kg/m   Allergies  Allergen Reactions  . Sulfa Antibiotics Other (See Comments)    Unknown    Wt Readings from Last 3 Encounters:  03/08/19 163 lb 12.8 oz (74.3 kg)  07/14/18 167 lb (75.8 kg)  05/12/17 177 lb (80.3 kg)    Physical Exam Constitutional:      Appearance: She is well-developed.  HENT:     Head: Normocephalic and atraumatic.  Eyes:     Conjunctiva/sclera: Conjunctivae normal.     Pupils: Pupils are equal, round, and reactive to light.  Cardiovascular:     Rate and Rhythm: Normal rate and regular rhythm.  Pulmonary:     Effort: Pulmonary effort is normal.  Skin:    General: Skin is warm and dry.     Findings: No rash.  Neurological:     Mental Status: She is alert and oriented to person, place, and time.     Deep Tendon  Reflexes: Reflexes are normal and symmetric.         Assessment & Plan:   1. Bursitis of left shoulder - meloxicam (MOBIC) 7.5 MG tablet; Take 1 tablet (7.5 mg total) by mouth daily.  Dispense: 90 tablet; Refill: 1   Continue all other maintenance medications as listed above.  Follow up plan: Return if symptoms worsen or fail to improve.  Educational handout given for shoulder exercises  Remus LofflerAngel S. Humna Moorehouse PA-C [facility] [address]  [place], [place] [id number] [phone]   03/12/2019, 10:12 PM

## 2019-03-17 ENCOUNTER — Encounter: Payer: Self-pay | Admitting: Nurse Practitioner

## 2019-03-17 ENCOUNTER — Ambulatory Visit (INDEPENDENT_AMBULATORY_CARE_PROVIDER_SITE_OTHER): Payer: Medicare Other | Admitting: Nurse Practitioner

## 2019-03-17 DIAGNOSIS — I10 Essential (primary) hypertension: Secondary | ICD-10-CM | POA: Diagnosis not present

## 2019-03-17 DIAGNOSIS — F3130 Bipolar disorder, current episode depressed, mild or moderate severity, unspecified: Secondary | ICD-10-CM | POA: Diagnosis not present

## 2019-03-17 DIAGNOSIS — E7800 Pure hypercholesterolemia, unspecified: Secondary | ICD-10-CM | POA: Insufficient documentation

## 2019-03-17 DIAGNOSIS — Z6828 Body mass index (BMI) 28.0-28.9, adult: Secondary | ICD-10-CM | POA: Diagnosis not present

## 2019-03-17 DIAGNOSIS — F1121 Opioid dependence, in remission: Secondary | ICD-10-CM

## 2019-03-17 MED ORDER — CITALOPRAM HYDROBROMIDE 40 MG PO TABS: 40.0000 mg | ORAL_TABLET | Freq: Every day | ORAL | 1 refills | Status: DC

## 2019-03-17 MED ORDER — LISINOPRIL 10 MG PO TABS: 10.0000 mg | ORAL_TABLET | Freq: Every day | ORAL | 1 refills | Status: DC

## 2019-03-17 MED ORDER — ATORVASTATIN CALCIUM 80 MG PO TABS: 80.0000 mg | ORAL_TABLET | Freq: Every day | ORAL | 1 refills | Status: DC

## 2019-03-17 NOTE — Progress Notes (Signed)
Virtual Visit via telephone Note Due to COVID-19 pandemic this visit was conducted virtually. This visit type was conducted due to national recommendations for restrictions regarding the COVID-19 Pandemic (e.g. social distancing, sheltering in place) in an effort to limit this patient's exposure and mitigate transmission in our community. All issues noted in this document were discussed and addressed.  A physical exam was not performed with this format.  I connected with Karen Serrano on 03/17/19 at 12:45 by telephone and verified that I am speaking with the correct person using two identifiers. Karen Serrano is currently located at at [facility] in [place] and her husband is currently with her during visit. The provider, Karen Hassell Done, FNP is located in their office at time of visit.  I discussed the limitations, risks, security and privacy concerns of performing an evaluation and management service by telephone and the availability of in person appointments. I also discussed with the patient that there may be a patient responsible charge related to this service. The patient expressed understanding and agreed to proceed.  * attempted to call patient at 12:30- phone kept saying all circuts are busy  History and Present Illness:   Chief Complaint: Medical Management of Chronic Issues    HPI:  1. Essential hypertension No c/o chest pain, sob or headache. Does not check blood pressure at home. BP Readings from Last 3 Encounters:  03/08/19 104/69  07/14/18 124/72  05/12/17 124/82     2. Bipolar I disorder, most recent episode depressed ([place]) Patient is on celexa and is doing quite well.  Depression screen [facility] 2/9 03/17/2019 03/08/2019 12/08/2018  Decreased Interest 0 0 1  Down, Depressed, Hopeless 0 0 1  PHQ - 2 Score 0 0 2  Altered sleeping - - 1  Tired, decreased energy - - 1  Change in appetite - - 0  Feeling bad or failure about yourself  - - 0   Trouble concentrating - - 1  Moving slowly or fidgety/restless - - 0  Suicidal thoughts - - 0  PHQ-9 Score - - 5  Difficult doing work/chores - - Somewhat difficult     3. BMI 28.0-28.9,adult No recent weight changes  4. Pure hypercholesterolemia Does not really watch diet and does very little exercise  5. Opioid dependence in remission [facility]) We currently do not have her on any opoids.    Outpatient Encounter Medications as of 03/17/2019  Medication Sig  . atorvastatin (LIPITOR) 80 MG tablet Take 1 tablet (80 mg total) by mouth daily.  . citalopram (CELEXA) 40 MG tablet TAKE 1 TABLET BY MOUTH EVERY DAY  . lisinopril (ZESTRIL) 10 MG tablet Take 1 tablet (10 mg total) by mouth daily.  . meloxicam (MOBIC) 7.5 MG tablet Take 1 tablet (7.5 mg total) by mouth daily.  . Multiple Vitamin (MULTIVITAMIN WITH MINERALS) TABS Take 1 tablet by mouth daily.     Past Surgical History:  Procedure Laterality Date  . ABDOMINAL HYSTERECTOMY    . ADENOIDECTOMY    . BACK SURGERY    . CESAREAN SECTION    . EYE SURGERY    . SPINE SURGERY    . TONSILLECTOMY      Family History  Problem Relation Age of Onset  . Cancer Mother     New complaints: None today  Social history: Lives with husband and her husbands mother  Controlled substance contract: N/A     Review of Systems  Constitutional: Negative for diaphoresis and weight  loss.  Eyes: Negative for blurred vision, double vision and pain.  Respiratory: Negative for shortness of breath.   Cardiovascular: Negative for chest pain, palpitations, orthopnea and leg swelling.  Gastrointestinal: Negative for abdominal pain.  Skin: Negative for rash.  Neurological: Negative for dizziness, sensory change, loss of consciousness, weakness and headaches.  Endo/Heme/Allergies: Negative for polydipsia. Does not bruise/bleed easily.  Psychiatric/Behavioral: Negative for memory loss. The patient does not have insomnia.   All other systems  reviewed and are negative.    Observations/Objective: Alert and oriented- answers all questions appropriately No distress  Assessment and Plan: Karen Serrano comes in today with chief complaint of Medical Management of Chronic Issues   Diagnosis and orders addressed:  1. Essential hypertension Low sodium diet - lisinopril (ZESTRIL) 10 MG tablet; Take 1 tablet (10 mg total) by mouth daily.  Dispense: 90 tablet; Refill: 1  2. Bipolar I disorder, most recent episode depressed (HCC) Stress management - citalopram (CELEXA) 40 MG tablet; Take 1 tablet (40 mg total) by mouth daily.  Dispense: 90 tablet; Refill: 1  3. BMI 28.0-28.9,adult Discussed diet and exercise for person with BMI >25 Will recheck weight in 3-6 months  4. Pure hypercholesterolemia Low fat diet - atorvastatin (LIPITOR) 80 MG tablet; Take 1 tablet (80 mg total) by mouth daily.  Dispense: 90 tablet; Refill: 1  5. Opioid dependence in remission [facility](HCC) Currently on no opoids   Previous lab results reviewed Health Maintenance reviewed Diet and exercise encouraged  Follow up plan: 3 months     I discussed the assessment and treatment plan with the patient. The patient was provided an opportunity to ask questions and all were answered. The patient agreed with the plan and demonstrated an understanding of the instructions.   The patient was advised to call back or seek an in-person evaluation if the symptoms worsen or if the condition fails to improve as anticipated.  The above assessment and management plan was discussed with the patient. The patient verbalized understanding of and has agreed to the management plan. Patient is aware to call the clinic if symptoms persist or worsen. Patient is aware when to return to the clinic for a follow-up visit. Patient educated on when it is appropriate to go to the emergency department.   Time call ended:  13:02  I provided 17 minutes of non-face-to-face time during  this encounter.    Karen Daphine DeutscherMartin, FNP

## 2019-04-03 DIAGNOSIS — E785 Hyperlipidemia, unspecified: Secondary | ICD-10-CM | POA: Diagnosis not present

## 2019-04-03 DIAGNOSIS — J449 Chronic obstructive pulmonary disease, unspecified: Secondary | ICD-10-CM | POA: Diagnosis not present

## 2019-04-03 DIAGNOSIS — G5622 Lesion of ulnar nerve, left upper limb: Secondary | ICD-10-CM | POA: Diagnosis not present

## 2019-04-03 DIAGNOSIS — I10 Essential (primary) hypertension: Secondary | ICD-10-CM | POA: Diagnosis not present

## 2019-06-19 ENCOUNTER — Encounter: Payer: Self-pay | Admitting: Nurse Practitioner

## 2019-06-19 ENCOUNTER — Ambulatory Visit (INDEPENDENT_AMBULATORY_CARE_PROVIDER_SITE_OTHER): Payer: Commercial Managed Care - HMO | Admitting: Nurse Practitioner

## 2019-06-19 DIAGNOSIS — E7800 Pure hypercholesterolemia, unspecified: Secondary | ICD-10-CM

## 2019-06-19 DIAGNOSIS — Z6828 Body mass index (BMI) 28.0-28.9, adult: Secondary | ICD-10-CM

## 2019-06-19 DIAGNOSIS — F3130 Bipolar disorder, current episode depressed, mild or moderate severity, unspecified: Secondary | ICD-10-CM

## 2019-06-19 DIAGNOSIS — I10 Essential (primary) hypertension: Secondary | ICD-10-CM | POA: Diagnosis not present

## 2019-06-19 MED ORDER — ATORVASTATIN CALCIUM 80 MG PO TABS
80.0000 mg | ORAL_TABLET | Freq: Every day | ORAL | 1 refills | Status: DC
Start: 2019-06-19 — End: 2019-11-23

## 2019-06-19 MED ORDER — CITALOPRAM HYDROBROMIDE 40 MG PO TABS: 40.0000 mg | ORAL_TABLET | Freq: Every day | ORAL | 1 refills | Status: DC

## 2019-06-19 MED ORDER — LISINOPRIL 10 MG PO TABS: 10.0000 mg | ORAL_TABLET | Freq: Every day | ORAL | 1 refills | Status: DC

## 2019-06-19 NOTE — Progress Notes (Signed)
Virtual Visit via telephone Note Due to COVID-19 pandemic this visit was conducted virtually. This visit type was conducted due to national recommendations for restrictions regarding the COVID-19 Pandemic (e.g. social distancing, sheltering in place) in an effort to limit this patient's exposure and mitigate transmission in our community. All issues noted in this document were discussed and addressed.  A physical exam was not performed with this format.  I connected with Karen Serrano on 06/19/19 at 1:40 by telephone and verified that I am speaking with the correct person using two identifiers. Karen Serrano is [place] and no one is currently with her during visit. The provider, Mary-Margaret Hassell Done, FNP is located in their office at time of visit.  I discussed the limitations, risks, security and privacy concerns of performing an evaluation and management service by telephone and the availability of in person appointments. I also discussed with the patient that there may be a patient responsible charge related to this service. The patient expressed understanding and agreed to proceed.   History and Present Illness:   Chief Complaint: Medical Management of Chronic Issues    HPI:  1. Essential hypertension No c/o chest pain, sob or headache. Does not check blood pressure at home. BP Readings from Last 3 Encounters:  03/08/19 104/69  07/14/18 124/72  05/12/17 124/82     2. Pure hypercholesterolemia Trying  To watch diet but doe snot exercise much. Lab Results  Component Value Date   CHOL 182 03/06/2016   HDL 48 03/06/2016   LDLCALC 112 (H) 03/06/2016   TRIG 108 03/06/2016   CHOLHDL 3.8 03/06/2016     3. Bipolar I disorder, most recent episode depressed ([place]) Is on celexa daily and is doing well. She takes tumericdaily which really helps her depression. Depression screen [facility] 2/9 06/19/2019 03/17/2019 03/08/2019  Decreased Interest 0 0 0  Down,  Depressed, Hopeless 0 0 0  PHQ - 2 Score 0 0 0  Altered sleeping - - -  Tired, decreased energy - - -  Change in appetite - - -  Feeling bad or failure about yourself  - - -  Trouble concentrating - - -  Moving slowly or fidgety/restless - - -  Suicidal thoughts - - -  PHQ-9 Score - - -  Difficult doing work/chores - - -     4. BMI 28.0-28.9,adult No recent weight gain Wt Readings from Last 3 Encounters:  03/08/19 163 lb 12.8 oz (74.3 kg)  07/14/18 167 lb (75.8 kg)  05/12/17 177 lb (80.3 kg)   BMI Readings from Last 3 Encounters:  03/08/19 25.65 kg/m  07/14/18 26.16 kg/m  05/12/17 27.72 kg/m       Outpatient Encounter Medications as of 06/19/2019  Medication Sig  . atorvastatin (LIPITOR) 80 MG tablet Take 1 tablet (80 mg total) by mouth daily.  . citalopram (CELEXA) 40 MG tablet Take 1 tablet (40 mg total) by mouth daily.  Marland Kitchen lisinopril (ZESTRIL) 10 MG tablet Take 1 tablet (10 mg total) by mouth daily.  . meloxicam (MOBIC) 7.5 MG tablet Take 1 tablet (7.5 mg total) by mouth daily.  . Multiple Vitamin (MULTIVITAMIN WITH MINERALS) TABS Take 1 tablet by mouth daily.     Past Surgical History:  Procedure Laterality Date  . ABDOMINAL HYSTERECTOMY    . ADENOIDECTOMY    . BACK SURGERY    . CESAREAN SECTION    . EYE SURGERY    . SPINE SURGERY    .  TONSILLECTOMY      Family History  Problem Relation Age of Onset  . Cancer Mother     New complaints: None today  Social history: Lives in [place] and is taking care of her mother in law  Controlled substance contract: n/a    Review of Systems  Constitutional: Negative for diaphoresis and weight loss.  Eyes: Negative for blurred vision, double vision and pain.  Respiratory: Negative for shortness of breath.   Cardiovascular: Negative for chest pain, palpitations, orthopnea and leg swelling.  Gastrointestinal: Negative for abdominal pain.  Skin: Negative for rash.  Neurological: Negative for dizziness,  sensory change, loss of consciousness, weakness and headaches.  Endo/Heme/Allergies: Negative for polydipsia. Does not bruise/bleed easily.  Psychiatric/Behavioral: Negative for memory loss. The patient does not have insomnia.   All other systems reviewed and are negative.    Observations/Objective: Alert and oriented- answers all questions appropriately No distress    Assessment and Plan: Karen Serrano comes in today with chief complaint of Medical Management of Chronic Issues   Diagnosis and orders addressed:  1. Essential hypertension Low sodium diet - CMP14+EGFR; Future - lisinopril (ZESTRIL) 10 MG tablet; Take 1 tablet (10 mg total) by mouth daily.  Dispense: 90 tablet; Refill: 1  2. Pure hypercholesterolemia Low fat diet - Lipid panel; Future - atorvastatin (LIPITOR) 80 MG tablet; Take 1 tablet (80 mg total) by mouth daily.  Dispense: 90 tablet; Refill: 1  3. Bipolar I disorder, most recent episode depressed ([place]) Stress management - citalopram (CELEXA) 40 MG tablet; Take 1 tablet (40 mg total) by mouth daily.  Dispense: 90 tablet; Refill: 1  4. BMI 28.0-28.9,adult Discussed diet and exercise for person with BMI >25 Will recheck weight in 3-6 months   patient will come in next month when she is in town from [facility] Maintenance reviewed Diet and exercise encouraged  Follow up plan: 6 months     I discussed the assessment and treatment plan with the patient. The patient was provided an opportunity to ask questions and all were answered. The patient agreed with the plan and demonstrated an understanding of the instructions.   The patient was advised to call back or seek an in-person evaluation if the symptoms worsen or if the condition fails to improve as anticipated.  The above assessment and management plan was discussed with the patient. The patient verbalized understanding of and has agreed to the management plan. Patient is aware to call the  clinic if symptoms persist or worsen. Patient is aware when to return to the clinic for a follow-up visit. Patient educated on when it is appropriate to go to the emergency department.   Time call ended:  1:53  I provided 13 minutes of non-face-to-face time during this encounter.    Mary-Margaret Hassell Done, FNP

## 2019-10-25 ENCOUNTER — Other Ambulatory Visit: Payer: Self-pay | Admitting: Nurse Practitioner

## 2019-10-25 DIAGNOSIS — F3130 Bipolar disorder, current episode depressed, mild or moderate severity, unspecified: Secondary | ICD-10-CM

## 2019-11-14 ENCOUNTER — Telehealth: Payer: Commercial Managed Care - HMO | Admitting: Nurse Practitioner

## 2019-11-23 ENCOUNTER — Telehealth (INDEPENDENT_AMBULATORY_CARE_PROVIDER_SITE_OTHER): Payer: Medicare Other | Admitting: Nurse Practitioner

## 2019-11-23 ENCOUNTER — Encounter: Payer: Self-pay | Admitting: Nurse Practitioner

## 2019-11-23 DIAGNOSIS — E7800 Pure hypercholesterolemia, unspecified: Secondary | ICD-10-CM

## 2019-11-23 DIAGNOSIS — I10 Essential (primary) hypertension: Secondary | ICD-10-CM

## 2019-11-23 DIAGNOSIS — F3130 Bipolar disorder, current episode depressed, mild or moderate severity, unspecified: Secondary | ICD-10-CM

## 2019-11-23 DIAGNOSIS — Z6828 Body mass index (BMI) 28.0-28.9, adult: Secondary | ICD-10-CM | POA: Diagnosis not present

## 2019-11-23 DIAGNOSIS — F1121 Opioid dependence, in remission: Secondary | ICD-10-CM

## 2019-11-23 MED ORDER — LISINOPRIL 10 MG PO TABS: 10.0000 mg | ORAL_TABLET | Freq: Every day | ORAL | 1 refills | Status: DC

## 2019-11-23 MED ORDER — CITALOPRAM HYDROBROMIDE 40 MG PO TABS: 40.0000 mg | ORAL_TABLET | Freq: Every day | ORAL | 1 refills | Status: DC

## 2019-11-23 MED ORDER — ATORVASTATIN CALCIUM 80 MG PO TABS: 80.0000 mg | ORAL_TABLET | Freq: Every day | ORAL | 1 refills | Status: DC

## 2019-11-23 NOTE — Progress Notes (Signed)
Virtual Visit via video Note   Due to COVID-19 pandemic this visit was conducted virtually. This visit type was conducted due to national recommendations for restrictions regarding the COVID-19 Pandemic (e.g. social distancing, sheltering in place) in an effort to limit this patient's exposure and mitigate transmission in our community. All issues noted in this document were discussed and addressed.  A physical exam was not performed with this format.  I connected with  Karen Serrano  on 11/23/19 at 11:40 by video and verified that I am speaking with the correct person using two identifiers. Karen Serrano is currently located at [employer] and no one is currently with  her during visit. The provider, [name], FNP is located in their office at time of visit.  I discussed the limitations, risks, security and privacy concerns of performing an evaluation and management service by telephone and the availability of in person appointments. I also discussed with the patient that there may be a patient responsible charge related to this service. The patient expressed understanding and agreed to proceed.   History and Present Illness:   Chief Complaint: medical management of chronic issues   HPI:  1. Essential hypertension No c/o chest pain, sob or headache. Does not check blood pressure at home. BP Readings from Last 3 Encounters:  03/08/19 104/69  07/14/18 124/72  05/12/17 124/82     2. Pure hypercholesterolemia Has been watching diet but no exercise. Lab Results  Component Value Date   CHOL 182 03/06/2016   HDL 48 03/06/2016   LDLCALC 112 (H) 03/06/2016   TRIG 108 03/06/2016   CHOLHDL 3.8 03/06/2016     3. Bipolar I disorder, most recent episode depressed ([facility]) Is on celexa and is dong well. She added turmeric and that seems to has helped. She has had very few depressive moods. Depression screen [facility] 2/9 11/23/2019 06/19/2019 03/17/2019  Decreased Interest  0 0 0  Down, Depressed, Hopeless 0 0 0  PHQ - 2 Score 0 0 0  Altered sleeping - - -  Tired, decreased energy - - -  Change in appetite - - -  Feeling bad or failure about yourself  - - -  Trouble concentrating - - -  Moving slowly or fidgety/restless - - -  Suicidal thoughts - - -  PHQ-9 Score - - -  Difficult doing work/chores - - -     4. BMI 28.0-28.9,adult No recent weight chnages Wt Readings from Last 3 Encounters:  03/08/19 163 lb 12.8 oz (74.3 kg)  07/14/18 167 lb (75.8 kg)  05/12/17 177 lb (80.3 kg)  ' BMI Readings from Last 3 Encounters:  03/08/19 25.65 kg/m  07/14/18 26.16 kg/m  05/12/17 27.72 kg/m     5. Opioid dependence in remission [facility]) Is doing well. Has had no opoids    Outpatient Encounter Medications as of 11/23/2019  Medication Sig  . atorvastatin (LIPITOR) 80 MG tablet Take 1 tablet (80 mg total) by mouth daily.  . citalopram (CELEXA) 40 MG tablet Take 1 tablet (40 mg total) by mouth daily. Needs to be seen before next refill.  Marland Kitchen lisinopril (ZESTRIL) 10 MG tablet Take 1 tablet (10 mg total) by mouth daily.  . meloxicam (MOBIC) 7.5 MG tablet Take 1 tablet (7.5 mg total) by mouth daily.  . Multiple Vitamin (MULTIVITAMIN WITH MINERALS) TABS Take 1 tablet by mouth daily.   No facility-administered encounter medications on file as of 11/23/2019.    Past Surgical History:  Procedure Laterality Date  . ABDOMINAL HYSTERECTOMY    . ADENOIDECTOMY    . BACK SURGERY    . CESAREAN SECTION    . EYE SURGERY    . SPINE SURGERY    . TONSILLECTOMY      Family History  Problem Relation Age of Onset  . Cancer Mother     New complaints: None today  Social history: Is in [place] taking care of her mother in law  Controlled substance contract: n/a    Review of Systems  Constitutional: Negative for diaphoresis and weight loss.  Eyes: Negative for blurred vision, double vision and pain.  Respiratory: Negative for shortness of breath.     Cardiovascular: Negative for chest pain, palpitations, orthopnea and leg swelling.  Gastrointestinal: Negative for abdominal pain.  Skin: Negative for rash.  Neurological: Negative for dizziness, sensory change, loss of consciousness, weakness and headaches.  Endo/Heme/Allergies: Negative for polydipsia. Does not bruise/bleed easily.  Psychiatric/Behavioral: Negative for memory loss. The patient does not have insomnia.   All other systems reviewed and are negative.      Observations/Objective: Alert and oriented- answers all questions appropriately No distress     Assessment and Plan: Karen Serrano comes in today with chief complaint of medical managem,ent of chronic issues  Diagnosis and orders addressed:  1. Essential hypertension Low sodium diet - lisinopril (ZESTRIL) 10 MG tablet; Take 1 tablet (10 mg total) by mouth daily.  Dispense: 90 tablet; Refill: 1  2. Pure hypercholesterolemia Low fat diet - atorvastatin (LIPITOR) 80 MG tablet; Take 1 tablet (80 mg total) by mouth daily.  Dispense: 90 tablet; Refill: 1  3. Bipolar I disorder, most recent episode depressed ([place]) Stress management - citalopram (CELEXA) 40 MG tablet; Take 1 tablet (40 mg total) by mouth daily. Needs to be seen before next refill.  Dispense: 90 tablet; Refill: 1  4. BMI 28.0-28.9,adult Discussed diet and exercise for person with BMI >25 Will recheck weight in 3-6 months  5. Opioid dependence in remission [facility]) continue to stay away from opoids   Labs pending- patient will come in for face to face in July and hav elabs drawn Health Maintenance reviewed Diet and exercise encouraged  Follow up plan: 3 months     I discussed the assessment and treatment plan with the patient. The patient was provided an opportunity to ask questions and all were answered. The patient agreed with the plan and demonstrated an understanding of the instructions.   The patient was advised to call back or  seek an in-person evaluation if the symptoms worsen or if the condition fails to improve as anticipated.  The above assessment and management plan was discussed with the patient. The patient verbalized understanding of and has agreed to the management plan. Patient is aware to call the clinic if symptoms persist or worsen. Patient is aware when to return to the clinic for a follow-up visit. Patient educated on when it is appropriate to go to the emergency department.   Time call ended:11:56  I provided 16 minutes of face-to-face time during this encounter.    Mary-Margaret Hassell Done, FNP

## 2019-12-04 ENCOUNTER — Other Ambulatory Visit: Payer: Self-pay | Admitting: *Deleted

## 2019-12-04 DIAGNOSIS — M7552 Bursitis of left shoulder: Secondary | ICD-10-CM

## 2019-12-04 MED ORDER — MELOXICAM 7.5 MG PO TABS: 7.5000 mg | ORAL_TABLET | Freq: Every day | ORAL | 1 refills | Status: DC

## 2020-01-27 ENCOUNTER — Other Ambulatory Visit: Payer: Self-pay | Admitting: Nurse Practitioner

## 2020-01-27 DIAGNOSIS — F3130 Bipolar disorder, current episode depressed, mild or moderate severity, unspecified: Secondary | ICD-10-CM

## 2020-01-31 ENCOUNTER — Other Ambulatory Visit: Payer: Self-pay | Admitting: Nurse Practitioner

## 2020-01-31 DIAGNOSIS — Z1231 Encounter for screening mammogram for malignant neoplasm of breast: Secondary | ICD-10-CM

## 2020-02-18 ENCOUNTER — Other Ambulatory Visit: Payer: Self-pay | Admitting: Nurse Practitioner

## 2020-02-18 DIAGNOSIS — I10 Essential (primary) hypertension: Secondary | ICD-10-CM

## 2020-02-18 DIAGNOSIS — E7800 Pure hypercholesterolemia, unspecified: Secondary | ICD-10-CM

## 2020-02-27 ENCOUNTER — Encounter: Payer: Self-pay | Admitting: Nurse Practitioner

## 2020-02-27 ENCOUNTER — Ambulatory Visit: Payer: Self-pay | Admitting: Nurse Practitioner

## 2020-02-27 ENCOUNTER — Other Ambulatory Visit: Payer: Self-pay

## 2020-02-27 ENCOUNTER — Ambulatory Visit (INDEPENDENT_AMBULATORY_CARE_PROVIDER_SITE_OTHER): Payer: Medicare Other | Admitting: Nurse Practitioner

## 2020-02-27 VITALS — BP 125/72 | HR 107 | Temp 98.6°F | Resp 20 | Ht 67.0 in | Wt 180.0 lb

## 2020-02-27 DIAGNOSIS — E7800 Pure hypercholesterolemia, unspecified: Secondary | ICD-10-CM

## 2020-02-27 DIAGNOSIS — M25511 Pain in right shoulder: Secondary | ICD-10-CM

## 2020-02-27 DIAGNOSIS — Z6828 Body mass index (BMI) 28.0-28.9, adult: Secondary | ICD-10-CM

## 2020-02-27 DIAGNOSIS — I10 Essential (primary) hypertension: Secondary | ICD-10-CM | POA: Diagnosis not present

## 2020-02-27 DIAGNOSIS — F3130 Bipolar disorder, current episode depressed, mild or moderate severity, unspecified: Secondary | ICD-10-CM

## 2020-02-27 DIAGNOSIS — M7552 Bursitis of left shoulder: Secondary | ICD-10-CM

## 2020-02-27 DIAGNOSIS — M25512 Pain in left shoulder: Secondary | ICD-10-CM

## 2020-02-27 MED ORDER — CITALOPRAM HYDROBROMIDE 40 MG PO TABS: ORAL_TABLET | ORAL | 1 refills | Status: DC

## 2020-02-27 MED ORDER — ATORVASTATIN CALCIUM 80 MG PO TABS: 80.0000 mg | ORAL_TABLET | Freq: Every day | ORAL | 1 refills | Status: DC

## 2020-02-27 MED ORDER — PREDNISONE 10 MG (21) PO TBPK: ORAL_TABLET | ORAL | 0 refills | Status: DC

## 2020-02-27 MED ORDER — LISINOPRIL 10 MG PO TABS: 10.0000 mg | ORAL_TABLET | Freq: Every day | ORAL | 1 refills | Status: DC

## 2020-02-27 MED ORDER — MELOXICAM 7.5 MG PO TABS: 7.5000 mg | ORAL_TABLET | Freq: Every day | ORAL | 1 refills | Status: DC

## 2020-02-27 NOTE — Patient Instructions (Signed)
Bursitis  Bursitis is when the fluid-filled sac (bursa) that covers and protects a joint is swollen (inflamed). Bursitis is most common near joints such as the knees, elbows, hips, and shoulders. It can cause pain and stiffness. Follow these instructions at home: Medicines  Take over-the-counter and prescription medicines only as told by your doctor.  If you were prescribed an antibiotic medicine, take it as told by your doctor. Do not stop taking it even if you start to feel better. General instructions   Rest the affected area as told by your doctor. ? If you can, raise (elevate) the affected area above the level of your heart while you are sitting or lying down. ? Avoid doing things that make the pain worse.  Use a splint, brace, pad, or walking aid as told by your doctor.  If directed, put ice on the affected area: ? If you have a removable splint or brace, take it off as told by your doctor. ? Put ice in a plastic bag. ? Place a towel between your skin and the bag, or between the splint or brace and the bag. ? Leave the ice on for 20 minutes, 2-3 times a day.  Keep all follow-up visits as told by your doctor. This is important. Preventing symptoms Do these things to help you not have symptoms again:  Wear knee pads if you kneel often.  Wear sturdy running or walking shoes that fit you well.  Take a lot of breaks during activities that involve doing the same movements again and again.  Before you do any activity that takes a lot of effort, get your body ready by stretching.  Stay at a healthy weight or lose weight if your doctor says you should. If you need help doing this, ask your doctor.  Exercise often. If you start any new physical activity, do it slowly. Contact a doctor if you:  Have a fever.  Have chills.  Have symptoms that do not get better with treatment or home care. Summary  Bursitis is when the fluid-filled sac (bursa) that covers and protects a joint  is swollen.  Rest the affected area as told by your doctor.  Avoid doing things that make the pain worse.  Put ice on the affected area as told by your doctor. This information is not intended to replace advice given to you by your health care provider. Make sure you discuss any questions you have with your health care provider. Document Revised: 07/02/2017 Document Reviewed: 06/04/2017 Elsevier Patient Education  2020 Elsevier Inc.  

## 2020-02-27 NOTE — Progress Notes (Signed)
Subjective:    Patient ID: Karen Serrano, female    DOB: June 21, 1966, 54 y.o.   MRN: [id number]   Chief Complaint: medical management of chronic issues     HPI:  1. Essential hypertension No c/ochest pain, sob or headache. Does not check blood pressure at home. BP Readings from Last 3 Encounters:  03/08/19 104/69  07/14/18 124/72  05/12/17 124/82     2. Pure hypercholesterolemia Does try to watch diet but does no exercise. She has not had lab work done in quite some time. Her last few visits have been telephone visits Lab Results  Component Value Date   CHOL 182 03/06/2016   HDL 48 03/06/2016   LDLCALC 112 (H) 03/06/2016   TRIG 108 03/06/2016   CHOLHDL 3.8 03/06/2016      3. Bipolar I disorder, most recent episode depressed ([place]) She is currently on celexa daily and is doing well. Depression screen [facility] 2/9 02/27/2020 11/23/2019 06/19/2019  Decreased Interest 0 0 0  Down, Depressed, Hopeless 0 0 0  PHQ - 2 Score 0 0 0  Altered sleeping - - -  Tired, decreased energy - - -  Change in appetite - - -  Feeling bad or failure about yourself  - - -  Trouble concentrating - - -  Moving slowly or fidgety/restless - - -  Suicidal thoughts - - -  PHQ-9 Score - - -  Difficult doing work/chores - - -   GAD 7 : Generalized Anxiety Score 02/27/2020  Nervous, Anxious, on Edge 3  Control/stop worrying 2  Worry too much - different things 2  Trouble relaxing 2  Restless 3  Easily annoyed or irritable 0  Afraid - awful might happen 1  Total GAD 7 Score 13  Anxiety Difficulty Somewhat difficult      4. BMI 28.0-28.9,adult Weight is up 23lbs since last visit Wt Readings from Last 3 Encounters:  02/27/20 180 lb (81.6 kg)  03/08/19 163 lb 12.8 oz (74.3 kg)  07/14/18 167 lb (75.8 kg)   BMI Readings from Last 3 Encounters:  02/27/20 28.19 kg/m  03/08/19 25.65 kg/m  07/14/18 26.16 kg/m       Outpatient Encounter Medications as of 02/27/2020  Medication Sig  .  citalopram (CELEXA) 40 MG tablet TAKE 1 TABLET BY MOUTH EVERY DAY. NEED APPT.  Marland Kitchen atorvastatin (LIPITOR) 80 MG tablet TAKE 1 TABLET BY MOUTH EVERY DAY  . lisinopril (ZESTRIL) 10 MG tablet TAKE 1 TABLET BY MOUTH EVERY DAY  . meloxicam (MOBIC) 7.5 MG tablet Take 1 tablet (7.5 mg total) by mouth daily.  . Multiple Vitamin (MULTIVITAMIN WITH MINERALS) TABS Take 1 tablet by mouth daily.     Past Surgical History:  Procedure Laterality Date  . ABDOMINAL HYSTERECTOMY    . ADENOIDECTOMY    . BACK SURGERY    . CESAREAN SECTION    . EYE SURGERY    . SPINE SURGERY    . TONSILLECTOMY      Family History  Problem Relation Age of Onset  . Cancer Mother     New complaints: bil shoulder pain. Hurts more in mornings then thorughout the day. Some movemnets increase pain.  Social history: Lives with her husband. They have been in [place] taking creof her mother in Sports [employer].  Controlled substance contract: n/a    Review of Systems  Constitutional: Negative for diaphoresis.  Eyes: Negative for pain.  Respiratory: Negative for shortness of breath.   Cardiovascular: Negative for chest  pain, palpitations and leg swelling.  Gastrointestinal: Negative for abdominal pain.  Endocrine: Negative for polydipsia.  Skin: Negative for rash.  Neurological: Negative for dizziness, weakness and headaches.  Hematological: Does not bruise/bleed easily.  All other systems reviewed and are negative.      Objective:   Physical Exam Vitals and nursing note reviewed.  Constitutional:      General: She is not in acute distress.    Appearance: Normal appearance. She is well-developed.  HENT:     Head: Normocephalic.     Nose: Nose normal.  Eyes:     Pupils: Pupils are equal, round, and reactive to light.  Neck:     Vascular: No carotid bruit or JVD.  Cardiovascular:     Rate and Rhythm: Normal rate and regular rhythm.     Heart sounds: Normal heart sounds.  Pulmonary:     Effort: Pulmonary effort  is normal. No respiratory distress.     Breath sounds: Normal breath sounds. No wheezing or rales.  Chest:     Chest wall: No tenderness.  Abdominal:     General: Bowel sounds are normal. There is no distension or abdominal bruit.     Palpations: Abdomen is soft. There is no hepatomegaly, splenomegaly, mass or pulsatile mass.     Tenderness: There is no abdominal tenderness.  Musculoskeletal:        General: Normal range of motion.     Cervical back: Normal range of motion and neck supple.  Lymphadenopathy:     Cervical: No cervical adenopathy.  Skin:    General: Skin is warm and dry.  Neurological:     Mental Status: She is alert and oriented to person, place, and time.     Deep Tendon Reflexes: Reflexes are normal and symmetric.  Psychiatric:        Mood and Affect: Mood is anxious.        Behavior: Behavior normal.        Thought Content: Thought content normal.        Judgment: Judgment normal.     BP 125/72   Pulse (!) 107   Temp 98.6 F (37 C) (Temporal)   Resp 20   Ht '5\' 7"'  (1.702 m)   Wt 180 lb (81.6 kg)   SpO2 97%   BMI 28.19 kg/m        Assessment & Plan:  Karen Serrano comes in today with chief complaint of Medical Management of Chronic Issues   Diagnosis and orders addressed:  1. Essential hypertension Low sodium diet - lisinopril (ZESTRIL) 10 MG tablet; Take 1 tablet (10 mg total) by mouth daily.  Dispense: 90 tablet; Refill: 1 - CBC with Differential/Platelet - CMP14+EGFR  2. Pure hypercholesterolemia Low fat diet - atorvastatin (LIPITOR) 80 MG tablet; Take 1 tablet (80 mg total) by mouth daily.  Dispense: 90 tablet; Refill: 1 - Lipid panel  3. Bipolar I disorder, most recent episode depressed ([place]) Stress management - citalopram (CELEXA) 40 MG tablet; TAKE 1 TABLET BY MOUTH EVERY DAY. NEED APPT.  Dispense: 90 tablet; Refill: 1  4. BMI 28.0-28.9,adult Discussed diet and exercise for person with BMI >25 Will recheck weight in 3-6  months  5. Acute pain of both shoulders Moist heat rest - predniSONE (STERAPRED UNI-PAK 21 TAB) 10 MG (21) TBPK tablet; As directed x 6 days  Dispense: 21 tablet; Refill: 0  6. Bursitis of left shoulder - meloxicam (MOBIC) 7.5 MG tablet; Take 1 tablet (7.5 mg total)  by mouth daily.  Dispense: 90 tablet; Refill: 1   Labs pending Health Maintenance reviewed Diet and exercise encouraged  Follow up plan: 6 months   Mary-Margaret Hassell Done, FNP

## 2020-02-28 ENCOUNTER — Ambulatory Visit
Admission: RE | Admit: 2020-02-28 | Discharge: 2020-02-28 | Disposition: A | Discharge status: Home / Self Care | Payer: Medicare Other | Source: Ambulatory Visit | Attending: Nurse Practitioner | Admitting: Nurse Practitioner

## 2020-02-28 DIAGNOSIS — Z1231 Encounter for screening mammogram for malignant neoplasm of breast: Secondary | ICD-10-CM | POA: Diagnosis not present

## 2020-02-28 LAB — CMP14+EGFR
ALT: 31 IU/L (ref 0–32)
AST: 25 IU/L (ref 0–40)
Albumin/Globulin Ratio: 2 (ref 1.2–2.2)
Albumin: 4.5 g/dL (ref 3.8–4.9)
Alkaline Phosphatase: 80 IU/L (ref 48–121)
BUN/Creatinine Ratio: 12 (ref 9–23)
BUN: 14 mg/dL (ref 6–24)
Bilirubin Total: 0.3 mg/dL (ref 0.0–1.2)
CO2: 24 mmol/L (ref 20–29)
Calcium: 9 mg/dL (ref 8.7–10.2)
Chloride: 102 mmol/L (ref 96–106)
Creatinine, Ser: 1.14 mg/dL — ABNORMAL HIGH (ref 0.57–1.00)
GFR calc Af Amer: 64 mL/min/{1.73_m2} (ref >59)
GFR calc non Af Amer: 55 mL/min/{1.73_m2} — ABNORMAL LOW (ref >59)
Globulin, Total: 2.2 g/dL (ref 1.5–4.5)
Glucose: 92 mg/dL (ref 65–99)
Potassium: 3.7 mmol/L (ref 3.5–5.2)
Sodium: 138 mmol/L (ref 134–144)
Total Protein: 6.7 g/dL (ref 6.0–8.5)

## 2020-02-28 LAB — CBC WITH DIFFERENTIAL/PLATELET
Basophils Absolute: 0.1 10*3/uL (ref 0.0–0.2)
Basos: 1 %
EOS (ABSOLUTE): 0.5 10*3/uL — ABNORMAL HIGH (ref 0.0–0.4)
Eos: 5 %
Hematocrit: 39.7 % (ref 34.0–46.6)
Hemoglobin: 13.1 g/dL (ref 11.1–15.9)
Immature Grans (Abs): 0.1 10*3/uL (ref 0.0–0.1)
Immature Granulocytes: 1 %
Lymphocytes Absolute: 3 10*3/uL (ref 0.7–3.1)
Lymphs: 28 %
MCH: 29.8 pg (ref 26.6–33.0)
MCHC: 33 g/dL (ref 31.5–35.7)
MCV: 90 fL (ref 79–97)
Monocytes Absolute: 1 10*3/uL — ABNORMAL HIGH (ref 0.1–0.9)
Monocytes: 10 %
Neutrophils Absolute: 6 10*3/uL (ref 1.4–7.0)
Neutrophils: 55 %
Platelets: 280 10*3/uL (ref 150–450)
RBC: 4.39 x10E6/uL (ref 3.77–5.28)
RDW: 12.5 % (ref 11.7–15.4)
WBC: 10.6 10*3/uL (ref 3.4–10.8)

## 2020-02-28 LAB — LIPID PANEL
Chol/HDL Ratio: 3 ratio (ref 0.0–4.4)
Cholesterol, Total: 136 mg/dL (ref 100–199)
HDL: 46 mg/dL (ref >39)
LDL Chol Calc (NIH): 70 mg/dL (ref 0–99)
Triglycerides: 108 mg/dL (ref 0–149)
VLDL Cholesterol Cal: 20 mg/dL (ref 5–40)

## 2020-03-04 ENCOUNTER — Other Ambulatory Visit: Payer: Self-pay | Admitting: Nurse Practitioner

## 2020-03-04 DIAGNOSIS — R928 Other abnormal and inconclusive findings on diagnostic imaging of breast: Secondary | ICD-10-CM

## 2020-04-01 ENCOUNTER — Ambulatory Visit
Admission: RE | Admit: 2020-04-01 | Discharge: 2020-04-01 | Disposition: A | Discharge status: Home / Self Care | Payer: Medicare Other | Source: Ambulatory Visit | Attending: Nurse Practitioner | Admitting: Nurse Practitioner

## 2020-04-01 ENCOUNTER — Other Ambulatory Visit: Payer: Self-pay

## 2020-04-01 DIAGNOSIS — R928 Other abnormal and inconclusive findings on diagnostic imaging of breast: Secondary | ICD-10-CM

## 2020-04-01 DIAGNOSIS — N6011 Diffuse cystic mastopathy of right breast: Secondary | ICD-10-CM | POA: Diagnosis not present

## 2020-04-01 DIAGNOSIS — R922 Inconclusive mammogram: Secondary | ICD-10-CM | POA: Diagnosis not present

## 2020-08-29 ENCOUNTER — Ambulatory Visit: Payer: Self-pay | Admitting: Nurse Practitioner

## 2020-08-29 ENCOUNTER — Other Ambulatory Visit: Payer: Self-pay | Admitting: Nurse Practitioner

## 2020-08-29 DIAGNOSIS — M7552 Bursitis of left shoulder: Secondary | ICD-10-CM

## 2020-09-26 ENCOUNTER — Other Ambulatory Visit: Payer: Self-pay

## 2020-09-26 ENCOUNTER — Ambulatory Visit (INDEPENDENT_AMBULATORY_CARE_PROVIDER_SITE_OTHER): Payer: Medicare Other | Admitting: Nurse Practitioner

## 2020-09-26 ENCOUNTER — Encounter: Payer: Self-pay | Admitting: Nurse Practitioner

## 2020-09-26 VITALS — BP 93/62 | HR 92 | Temp 97.1°F | Ht 67.0 in | Wt 182.4 lb

## 2020-09-26 DIAGNOSIS — M7552 Bursitis of left shoulder: Secondary | ICD-10-CM

## 2020-09-26 DIAGNOSIS — I10 Essential (primary) hypertension: Secondary | ICD-10-CM | POA: Diagnosis not present

## 2020-09-26 DIAGNOSIS — E7800 Pure hypercholesterolemia, unspecified: Secondary | ICD-10-CM | POA: Diagnosis not present

## 2020-09-26 DIAGNOSIS — Z6828 Body mass index (BMI) 28.0-28.9, adult: Secondary | ICD-10-CM

## 2020-09-26 DIAGNOSIS — R635 Abnormal weight gain: Secondary | ICD-10-CM

## 2020-09-26 DIAGNOSIS — Z23 Encounter for immunization: Secondary | ICD-10-CM

## 2020-09-26 DIAGNOSIS — F3130 Bipolar disorder, current episode depressed, mild or moderate severity, unspecified: Secondary | ICD-10-CM | POA: Diagnosis not present

## 2020-09-26 MED ORDER — ATORVASTATIN CALCIUM 80 MG PO TABS: 80.0000 mg | ORAL_TABLET | Freq: Every day | ORAL | 1 refills | Status: DC

## 2020-09-26 MED ORDER — CITALOPRAM HYDROBROMIDE 40 MG PO TABS: ORAL_TABLET | ORAL | 1 refills | Status: DC

## 2020-09-26 MED ORDER — LISINOPRIL 10 MG PO TABS: 10.0000 mg | ORAL_TABLET | Freq: Every day | ORAL | 1 refills | Status: DC

## 2020-09-26 MED ORDER — MELOXICAM 7.5 MG PO TABS: 7.5000 mg | ORAL_TABLET | Freq: Every day | ORAL | 1 refills | Status: DC

## 2020-09-26 NOTE — Progress Notes (Signed)
Subjective:    Patient ID: Karen Serrano, female    DOB: 04/01/1966, 55 y.o.   MRN: [id number]   Chief Complaint: Medical Management of Chronic Issues, Hypertension, and Hyperlipidemia    HPI:  1. Pure hypercholesterolemia Try avoid fatty foods and eat more healthy.   Lab Results  Component Value Date   CHOL 136 02/27/2020   HDL 46 02/27/2020   LDLCALC 70 02/27/2020   TRIG 108 02/27/2020   CHOLHDL 3.0 02/27/2020     2. Bipolar I disorder, most recent episode depressed ([place]) Has not had any recent mania or depressive episodes. Celexa is working well. Trying to incorporate more meditation and being more mindful.     3. BMI 28.0-28.9,adult Concerned with weight gain for the past 2 years. Has not attempted to cut calories.   Wt Readings from Last 3 Encounters:  09/26/20 182 lb 6.4 oz (82.7 kg)  02/27/20 180 lb (81.6 kg)  03/08/19 163 lb 12.8 oz (74.3 kg)     4. Primary hypertension Taking lisinopril. Managing well. No c/o cough. Does not check BP at home.   BP Readings from Last 3 Encounters:  09/26/20 93/62  02/27/20 125/72  03/08/19 104/69   5. Bursitis of Left Shoulder Doing well on Meloxicam.   Outpatient Encounter Medications as of 09/26/2020  Medication Sig   atorvastatin (LIPITOR) 80 MG tablet Take 1 tablet (80 mg total) by mouth daily.   citalopram (CELEXA) 40 MG tablet TAKE 1 TABLET BY MOUTH EVERY DAY. NEED APPT.   lisinopril (ZESTRIL) 10 MG tablet Take 1 tablet (10 mg total) by mouth daily.   meloxicam (MOBIC) 7.5 MG tablet TAKE 1 TABLET BY MOUTH EVERY DAY   Multiple Vitamin (MULTIVITAMIN WITH MINERALS) TABS Take 1 tablet by mouth daily.   [DISCONTINUED] predniSONE (STERAPRED UNI-PAK 21 TAB) 10 MG (21) TBPK tablet As directed x 6 days   No facility-administered encounter medications on file as of 09/26/2020.    Past Surgical History:  Procedure Laterality Date   ABDOMINAL HYSTERECTOMY     ADENOIDECTOMY     BACK SURGERY     CESAREAN  SECTION     EYE SURGERY     SPINE SURGERY     TONSILLECTOMY      Family History  Problem Relation Age of Onset   Cancer Mother     New complaints: None.   Social history: Lives in [place] right now, plans to possibly stay there. Lives with her husband.   Controlled substance contract: N/A     Review of Systems  Constitutional: Negative for activity change, appetite change, fatigue and fever.  Respiratory: Negative for cough, shortness of breath and wheezing.   Cardiovascular: Negative for chest pain and palpitations.  Gastrointestinal: Negative for abdominal distention, abdominal pain, constipation and diarrhea.  Genitourinary: Negative for difficulty urinating and dyspareunia.  Musculoskeletal: Negative for arthralgias, back pain and joint swelling.  Neurological: Negative for dizziness, light-headedness and headaches.  Psychiatric/Behavioral: Negative for behavioral problems, confusion and sleep disturbance.   Vitals:   09/26/20 1538  BP: 93/62  Pulse: 92  Temp: (!) 97.1 F (36.2 C)  SpO2: 95%       Objective:   Physical Exam Constitutional:      Appearance: Normal appearance.  Cardiovascular:     Rate and Rhythm: Normal rate and regular rhythm.     Pulses: Normal pulses.     Heart sounds: Normal heart sounds.  Pulmonary:     Effort: Pulmonary effort is normal.  Breath sounds: Normal breath sounds.  Abdominal:     General: Bowel sounds are normal.     Palpations: Abdomen is soft.  Musculoskeletal:        General: Normal range of motion.     Cervical back: Normal range of motion and neck supple.  Skin:    General: Skin is warm and dry.     Capillary Refill: Capillary refill takes less than 2 seconds.  Neurological:     Mental Status: She is alert.  Psychiatric:        Mood and Affect: Mood normal.        Behavior: Behavior normal.        Thought Content: Thought content normal.        Judgment: Judgment normal.    BP 93/62    Pulse 92    Temp  (!) 97.1 F (36.2 C) (Temporal)    Ht _0  (1.702 m)    Wt 182 lb 6.4 oz (82.7 kg)    SpO2 95%    BMI 28.57 kg/m         Assessment & Plan:  Karen Serrano comes in today with chief complaint of Medical Management of Chronic Issues, Hypertension, and Hyperlipidemia   Diagnosis and orders addressed:  1. Pure hypercholesterolemia Low fat diet - lisinopril (ZESTRIL) 10 MG tablet; Take 1 tablet (10 mg total) by mouth daily.  Dispense: 90 tablet; Refill: 1 - atorvastatin (LIPITOR) 80 MG tablet; Take 1 tablet (80 mg total) by mouth daily.  Dispense: 90 tablet; Refill: 1 - Lipid panel  2. Bipolar I disorder, most recent episode depressed ([place]) Stress management - citalopram (CELEXA) 40 MG tablet; TAKE 1 TABLET BY MOUTH EVERY DAY. NEED APPT.  Dispense: 90 tablet; Refill: 1  3. BMI 28.0-28.9,adult Discussed diet and exercise for person with BMI >25 Will recheck weight in 3-6 months  4. Primary hypertension Low sodium diet - CMP14+EGFR - CBC with Differential/Platelet  5. Need for immunization against influenza Flu shot - Flu Vaccine QUAD 36+ mos IM  6. Bursitis of left shoulder - meloxicam (MOBIC) 7.5 MG tablet; Take 1 tablet (7.5 mg total) by mouth daily.  Dispense: 90 tablet; Refill: 1  7. Weight gain exercise - Thyroid Panel With TSH   Labs pending Health Maintenance reviewed Diet and exercise encouraged  Follow up plan: 6 months   [place], FNP

## 2020-09-26 NOTE — Patient Instructions (Signed)
Exercising to Stay Healthy To become healthy and stay healthy, it is recommended that you do moderate-intensity and vigorous-intensity exercise. You can tell that you are exercising at a moderate intensity if your heart starts beating faster and you start breathing faster but can still hold a conversation. You can tell that you are exercising at a vigorous intensity if you are breathing much harder and faster and cannot hold a conversation while exercising. Exercising regularly is important. It has many health benefits, such as:  Improving overall fitness, flexibility, and endurance.  Increasing bone density.  Helping with weight control.  Decreasing body fat.  Increasing muscle strength.  Reducing stress and tension.  Improving overall health. How often should I exercise? Choose an activity that you enjoy, and set realistic goals. Your health care provider can help you make an activity plan that works for you. Exercise regularly as told by your health care provider. This may include:  Doing strength training two times a week, such as: ? Lifting weights. ? Using resistance bands. ? Push-ups. ? Sit-ups. ? Yoga.  Doing a certain intensity of exercise for a given amount of time. Choose from these options: ? A total of 150 minutes of moderate-intensity exercise every week. ? A total of 75 minutes of vigorous-intensity exercise every week. ? A mix of moderate-intensity and vigorous-intensity exercise every week. Children, pregnant women, people who have not exercised regularly, people who are overweight, and older adults may need to talk with a health care provider about what activities are safe to do. If you have a medical condition, be sure to talk with your health care provider before you start a new exercise program. What are some exercise ideas? Moderate-intensity exercise ideas include:  Walking 1 mile (1.6 km) in about 15  minutes.  Biking.  Hiking.  Golfing.  Dancing.  Water aerobics. Vigorous-intensity exercise ideas include:  Walking 4.5 miles (7.2 km) or more in about 1 hour.  Jogging or running 5 miles (8 km) in about 1 hour.  Biking 10 miles (16.1 km) or more in about 1 hour.  Lap swimming.  Roller-skating or in-line skating.  Cross-country skiing.  Vigorous competitive sports, such as football, basketball, and soccer.  Jumping rope.  Aerobic dancing.   What are some everyday activities that can help me to get exercise?  Yard work, such as: ? Pushing a lawn mower. ? Raking and bagging leaves.  Washing your car.  Pushing a stroller.  Shoveling snow.  Gardening.  Washing windows or floors. How can I be more active in my day-to-day activities?  Use stairs instead of an elevator.  Take a walk during your lunch break.  If you drive, park your car farther away from your work or school.  If you take public transportation, get off one stop early and walk the rest of the way.  Stand up or walk around during all of your indoor phone calls.  Get up, stretch, and walk around every 30 minutes throughout the day.  Enjoy exercise with a friend. Support to continue exercising will help you keep a regular routine of activity. What guidelines can I follow while exercising?  Before you start a new exercise program, talk with your health care provider.  Do not exercise so much that you hurt yourself, feel dizzy, or get very short of breath.  Wear comfortable clothes and wear shoes with good support.  Drink plenty of water while you exercise to prevent dehydration or heat stroke.  Work out until   your breathing and your heartbeat get faster. Where to find more information  U.S. Department of Health and Human Services: www.hhs.gov  Centers for Disease Control and Prevention (CDC): www.cdc.gov Summary  Exercising regularly is important. It will improve your overall fitness,  flexibility, and endurance.  Regular exercise also will improve your overall health. It can help you control your weight, reduce stress, and improve your bone density.  Do not exercise so much that you hurt yourself, feel dizzy, or get very short of breath.  Before you start a new exercise program, talk with your health care provider. This information is not intended to replace advice given to you by your health care provider. Make sure you discuss any questions you have with your health care provider. Document Revised: 07/02/2017 Document Reviewed: 06/10/2017 Elsevier Patient Education  2021 Else[employer] Inc.  

## 2020-09-27 LAB — CBC WITH DIFFERENTIAL/PLATELET
Basophils Absolute: 0 10*3/uL (ref 0.0–0.2)
Basos: 0 %
EOS (ABSOLUTE): 0.8 10*3/uL — ABNORMAL HIGH (ref 0.0–0.4)
Eos: 8 %
Hematocrit: 40 % (ref 34.0–46.6)
Hemoglobin: 13.4 g/dL (ref 11.1–15.9)
Immature Grans (Abs): 0 10*3/uL (ref 0.0–0.1)
Immature Granulocytes: 0 %
Lymphocytes Absolute: 3.1 10*3/uL (ref 0.7–3.1)
Lymphs: 35 %
MCH: 30.5 pg (ref 26.6–33.0)
MCHC: 33.5 g/dL (ref 31.5–35.7)
MCV: 91 fL (ref 79–97)
Monocytes Absolute: 0.9 10*3/uL (ref 0.1–0.9)
Monocytes: 10 %
Neutrophils Absolute: 4.2 10*3/uL (ref 1.4–7.0)
Neutrophils: 47 %
Platelets: 249 10*3/uL (ref 150–450)
RBC: 4.4 x10E6/uL (ref 3.77–5.28)
RDW: 12.6 % (ref 11.7–15.4)
WBC: 9 10*3/uL (ref 3.4–10.8)

## 2020-09-27 LAB — CMP14+EGFR
ALT: 21 IU/L (ref 0–32)
AST: 19 IU/L (ref 0–40)
Albumin/Globulin Ratio: 1.7 (ref 1.2–2.2)
Albumin: 4.1 g/dL (ref 3.8–4.9)
Alkaline Phosphatase: 82 IU/L (ref 44–121)
BUN/Creatinine Ratio: 16 (ref 9–23)
BUN: 19 mg/dL (ref 6–24)
Bilirubin Total: <0.2 mg/dL (ref 0.0–1.2)
CO2: 23 mmol/L (ref 20–29)
Calcium: 8.8 mg/dL (ref 8.7–10.2)
Chloride: 104 mmol/L (ref 96–106)
Creatinine, Ser: 1.16 mg/dL — ABNORMAL HIGH (ref 0.57–1.00)
GFR calc Af Amer: 62 mL/min/{1.73_m2} (ref >59)
GFR calc non Af Amer: 54 mL/min/{1.73_m2} — ABNORMAL LOW (ref >59)
Globulin, Total: 2.4 g/dL (ref 1.5–4.5)
Glucose: 80 mg/dL (ref 65–99)
Potassium: 4.9 mmol/L (ref 3.5–5.2)
Sodium: 141 mmol/L (ref 134–144)
Total Protein: 6.5 g/dL (ref 6.0–8.5)

## 2020-09-27 LAB — LIPID PANEL
Chol/HDL Ratio: 3.8 ratio (ref 0.0–4.4)
Cholesterol, Total: 151 mg/dL (ref 100–199)
HDL: 40 mg/dL (ref >39)
LDL Chol Calc (NIH): 87 mg/dL (ref 0–99)
Triglycerides: 138 mg/dL (ref 0–149)
VLDL Cholesterol Cal: 24 mg/dL (ref 5–40)

## 2020-09-27 LAB — THYROID PANEL WITH TSH
Free Thyroxine Index: 1.5 (ref 1.2–4.9)
T3 Uptake Ratio: 21 % — ABNORMAL LOW (ref 24–39)
T4, Total: 7.1 ug/dL (ref 4.5–12.0)
TSH: 1.74 u[IU]/mL (ref 0.450–4.500)

## 2020-11-07 ENCOUNTER — Other Ambulatory Visit: Payer: Self-pay | Admitting: Nurse Practitioner

## 2020-11-07 DIAGNOSIS — E7800 Pure hypercholesterolemia, unspecified: Secondary | ICD-10-CM

## 2020-11-12 ENCOUNTER — Other Ambulatory Visit: Payer: Self-pay | Admitting: Nurse Practitioner

## 2020-11-12 DIAGNOSIS — F3130 Bipolar disorder, current episode depressed, mild or moderate severity, unspecified: Secondary | ICD-10-CM

## 2020-11-28 ENCOUNTER — Other Ambulatory Visit: Payer: Self-pay | Admitting: Nurse Practitioner

## 2020-11-28 DIAGNOSIS — E7800 Pure hypercholesterolemia, unspecified: Secondary | ICD-10-CM

## 2021-02-10 ENCOUNTER — Other Ambulatory Visit: Payer: Self-pay | Admitting: Nurse Practitioner

## 2021-02-10 DIAGNOSIS — F3130 Bipolar disorder, current episode depressed, mild or moderate severity, unspecified: Secondary | ICD-10-CM

## 2021-02-20 ENCOUNTER — Ambulatory Visit: Payer: Self-pay | Admitting: Nurse Practitioner

## 2021-03-27 ENCOUNTER — Ambulatory Visit: Payer: Self-pay | Admitting: Nurse Practitioner

## 2021-04-01 ENCOUNTER — Ambulatory Visit (INDEPENDENT_AMBULATORY_CARE_PROVIDER_SITE_OTHER): Payer: Medicare Other | Admitting: Nurse Practitioner

## 2021-04-01 ENCOUNTER — Other Ambulatory Visit: Payer: Self-pay

## 2021-04-01 ENCOUNTER — Encounter: Payer: Self-pay | Admitting: Nurse Practitioner

## 2021-04-01 VITALS — BP 139/94 | HR 93 | Temp 98.0°F | Resp 20 | Ht 67.0 in | Wt 185.0 lb

## 2021-04-01 DIAGNOSIS — Z6828 Body mass index (BMI) 28.0-28.9, adult: Secondary | ICD-10-CM

## 2021-04-01 DIAGNOSIS — I10 Essential (primary) hypertension: Secondary | ICD-10-CM

## 2021-04-01 DIAGNOSIS — F3130 Bipolar disorder, current episode depressed, mild or moderate severity, unspecified: Secondary | ICD-10-CM | POA: Diagnosis not present

## 2021-04-01 DIAGNOSIS — E7800 Pure hypercholesterolemia, unspecified: Secondary | ICD-10-CM

## 2021-04-01 MED ORDER — CITALOPRAM HYDROBROMIDE 40 MG PO TABS: ORAL_TABLET | ORAL | 0 refills | Status: DC

## 2021-04-01 MED ORDER — ATORVASTATIN CALCIUM 80 MG PO TABS: 80.0000 mg | ORAL_TABLET | Freq: Every day | ORAL | 1 refills | Status: DC

## 2021-04-01 MED ORDER — LISINOPRIL 10 MG PO TABS: 10.0000 mg | ORAL_TABLET | Freq: Every day | ORAL | 1 refills | Status: DC

## 2021-04-01 NOTE — Patient Instructions (Signed)

## 2021-04-01 NOTE — Progress Notes (Signed)
Subjective:    Patient ID: Karen Serrano, female    DOB: November 16, 1965, 55 y.o.   MRN: [id number]  Chief Complaint: Medical Management of Chronic Issues    HPI:  1. Primary hypertension No c/o chest pain, sob or headache. Does not check blood pressure at home. BP Readings from Last 3 Encounters:  09/26/20 93/62  02/27/20 125/72  03/08/19 104/69     2. Pure hypercholesterolemia Des not really watch diet and does no dedicated exercise. Lab Results  Component Value Date   CHOL 151 09/26/2020   HDL 40 09/26/2020   LDLCALC 87 09/26/2020   TRIG 138 09/26/2020   CHOLHDL 3.8 09/26/2020     3. Bipolar I disorder, most recent episode depressed ([place]) Is on celexa and is doing well.  4. BMI 28.0-28.9,adult No recent weight changes Wt Readings from Last 3 Encounters:  09/26/20 182 lb 6.4 oz (82.7 kg)  02/27/20 180 lb (81.6 kg)  03/08/19 163 lb 12.8 oz (74.3 kg)   BMI Readings from Last 3 Encounters:  09/26/20 28.57 kg/m  02/27/20 28.19 kg/m  03/08/19 25.65 kg/m       Outpatient Encounter Medications as of 04/01/2021  Medication Sig   atorvastatin (LIPITOR) 80 MG tablet TAKE 1 TABLET BY MOUTH EVERY DAY   citalopram (CELEXA) 40 MG tablet TAKE 1 TABLET BY MOUTH EVERY DAY  (NEEDS TO BE SEEN BEFORE NEXT REFILL)   lisinopril (ZESTRIL) 10 MG tablet TAKE 1 TABLET BY MOUTH EVERY DAY   meloxicam (MOBIC) 7.5 MG tablet Take 1 tablet (7.5 mg total) by mouth daily.   Multiple Vitamin (MULTIVITAMIN WITH MINERALS) TABS Take 1 tablet by mouth daily.   No facility-administered encounter medications on file as of 04/01/2021.    Past Surgical History:  Procedure Laterality Date   ABDOMINAL HYSTERECTOMY     ADENOIDECTOMY     BACK SURGERY     CESAREAN SECTION     EYE SURGERY     SPINE SURGERY     TONSILLECTOMY      Family History  Problem Relation Age of Onset   Cancer Mother     New complaints: None today  Social history: Lives with her husband. They go back and  forth from here to  [place] substance contract: n/a     Review of Systems  Constitutional:  Negative for diaphoresis.  Eyes:  Negative for pain.  Respiratory:  Negative for shortness of breath.   Cardiovascular:  Negative for chest pain, palpitations and leg swelling.  Gastrointestinal:  Negative for abdominal pain.  Endocrine: Negative for polydipsia.  Skin:  Negative for rash.  Neurological:  Negative for dizziness, weakness and headaches.  Hematological:  Does not bruise/bleed easily.  All other systems reviewed and are negative.     Objective:   Physical Exam Vitals and nursing note reviewed.  Constitutional:      General: She is not in acute distress.    Appearance: Normal appearance. She is well-developed.  HENT:     Head: Normocephalic.     Right Ear: Tympanic membrane normal.     Left Ear: Tympanic membrane normal.     Nose: Nose normal.     Mouth/Throat:     Mouth: Mucous membranes are moist.  Eyes:     Pupils: Pupils are equal, round, and reactive to light.  Neck:     Vascular: No carotid bruit or JVD.  Cardiovascular:     Rate and Rhythm: Normal rate and regular rhythm.  Heart sounds: Normal heart sounds.  Pulmonary:     Effort: Pulmonary effort is normal. No respiratory distress.     Breath sounds: Normal breath sounds. No wheezing or rales.  Chest:     Chest wall: No tenderness.  Abdominal:     General: Bowel sounds are normal. There is no distension or abdominal bruit.     Palpations: Abdomen is soft. There is no hepatomegaly, splenomegaly, mass or pulsatile mass.     Tenderness: There is no abdominal tenderness.  Musculoskeletal:        General: Normal range of motion.     Cervical back: Normal range of motion and neck supple.  Lymphadenopathy:     Cervical: No cervical adenopathy.  Skin:    General: Skin is warm and dry.  Neurological:     Mental Status: She is alert and oriented to person, place, and time.     Deep  Tendon Reflexes: Reflexes are normal and symmetric.  Psychiatric:        Behavior: Behavior normal.        Thought Content: Thought content normal.        Judgment: Judgment normal.    BP (!) 139/94   Pulse 93   Temp 98 F (36.7 C) (Temporal)   Resp 20   Ht '5\' 7"'  (1.702 m)   Wt 185 lb (83.9 kg)   SpO2 99%   BMI 28.98 kg/m        Assessment & Plan:  Karen Serrano comes in today with chief complaint of Medical Management of Chronic Issues   Diagnosis and orders addressed:  1. Primary hypertension Low sodium die - CBC with Differential/Platelet - CMP14+EGFR  2. Pure hypercholesterolemia low fat diet - lisinopril (ZESTRIL) 10 MG tablet; Take 1 tablet (10 mg total) by mouth daily.  Dispense: 90 tablet; Refill: 1 - atorvastatin (LIPITOR) 80 MG tablet; Take 1 tablet (80 mg total) by mouth daily.  Dispense: 90 tablet; Refill: 1 - Lipid panel  3. Bipolar I disorder, most recent episode depressed ([place]) Stress management - citalopram (CELEXA) 40 MG tablet; TAKE 1 TABLET BY MOUTH EVERY DAY  (NEEDS TO BE SEEN BEFORE NEXT REFILL)  Dispense: 90 tablet; Refill: 0  4. BMI 28.0-28.9, Discussed diet and exercise for person with BMI >25 Will recheck weight in 3-6 months     Labs pending Health Maintenance reviewed Diet and exercise encouraged  Follow up plan: 6 months   Mary-Margaret Hassell Done, FNP

## 2021-04-02 ENCOUNTER — Encounter: Payer: Self-pay | Admitting: Nurse Practitioner

## 2021-04-02 LAB — CBC WITH DIFFERENTIAL/PLATELET
Basophils Absolute: 0 10*3/uL (ref 0.0–0.2)
Basos: 0 %
EOS (ABSOLUTE): 0.2 10*3/uL (ref 0.0–0.4)
Eos: 3 %
Hematocrit: 37.9 % (ref 34.0–46.6)
Hemoglobin: 12.3 g/dL (ref 11.1–15.9)
Immature Grans (Abs): 0 10*3/uL (ref 0.0–0.1)
Immature Granulocytes: 0 %
Lymphocytes Absolute: 3 10*3/uL (ref 0.7–3.1)
Lymphs: 36 %
MCH: 30.5 pg (ref 26.6–33.0)
MCHC: 32.5 g/dL (ref 31.5–35.7)
MCV: 94 fL (ref 79–97)
Monocytes Absolute: 0.8 10*3/uL (ref 0.1–0.9)
Monocytes: 10 %
Neutrophils Absolute: 4.2 10*3/uL (ref 1.4–7.0)
Neutrophils: 51 %
Platelets: 227 10*3/uL (ref 150–450)
RBC: 4.03 x10E6/uL (ref 3.77–5.28)
RDW: 13.3 % (ref 11.7–15.4)
WBC: 8.2 10*3/uL (ref 3.4–10.8)

## 2021-04-02 LAB — CMP14+EGFR
ALT: 26 IU/L (ref 0–32)
AST: 14 IU/L (ref 0–40)
Albumin/Globulin Ratio: 2.4 — ABNORMAL HIGH (ref 1.2–2.2)
Albumin: 4.3 g/dL (ref 3.8–4.9)
Alkaline Phosphatase: 79 IU/L (ref 44–121)
BUN/Creatinine Ratio: 26 — ABNORMAL HIGH (ref 9–23)
BUN: 21 mg/dL (ref 6–24)
Bilirubin Total: 0.2 mg/dL (ref 0.0–1.2)
CO2: 26 mmol/L (ref 20–29)
Calcium: 9 mg/dL (ref 8.7–10.2)
Chloride: 105 mmol/L (ref 96–106)
Creatinine, Ser: 0.82 mg/dL (ref 0.57–1.00)
Globulin, Total: 1.8 g/dL (ref 1.5–4.5)
Glucose: 75 mg/dL (ref 65–99)
Potassium: 4.3 mmol/L (ref 3.5–5.2)
Sodium: 145 mmol/L — ABNORMAL HIGH (ref 134–144)
Total Protein: 6.1 g/dL (ref 6.0–8.5)
eGFR: 85 mL/min/{1.73_m2} (ref >59)

## 2021-04-02 LAB — LIPID PANEL
Chol/HDL Ratio: 3.2 ratio (ref 0.0–4.4)
Cholesterol, Total: 159 mg/dL (ref 100–199)
HDL: 50 mg/dL (ref >39)
LDL Chol Calc (NIH): 88 mg/dL (ref 0–99)
Triglycerides: 115 mg/dL (ref 0–149)
VLDL Cholesterol Cal: 21 mg/dL (ref 5–40)

## 2021-04-20 ENCOUNTER — Other Ambulatory Visit: Payer: Self-pay | Admitting: Nurse Practitioner

## 2021-04-20 DIAGNOSIS — M7552 Bursitis of left shoulder: Secondary | ICD-10-CM

## 2021-05-10 ENCOUNTER — Other Ambulatory Visit: Payer: Self-pay | Admitting: Nurse Practitioner

## 2021-05-10 DIAGNOSIS — F3130 Bipolar disorder, current episode depressed, mild or moderate severity, unspecified: Secondary | ICD-10-CM

## 2021-06-06 ENCOUNTER — Other Ambulatory Visit: Payer: Self-pay | Admitting: Nurse Practitioner

## 2021-06-06 DIAGNOSIS — E7800 Pure hypercholesterolemia, unspecified: Secondary | ICD-10-CM

## 2021-07-30 ENCOUNTER — Ambulatory Visit: Payer: Medicare Other

## 2021-08-04 ENCOUNTER — Other Ambulatory Visit: Payer: Self-pay | Admitting: Nurse Practitioner

## 2021-08-04 DIAGNOSIS — E7800 Pure hypercholesterolemia, unspecified: Secondary | ICD-10-CM

## 2021-08-04 DIAGNOSIS — M7552 Bursitis of left shoulder: Secondary | ICD-10-CM

## 2021-08-05 ENCOUNTER — Ambulatory Visit (INDEPENDENT_AMBULATORY_CARE_PROVIDER_SITE_OTHER): Payer: Medicare Other

## 2021-08-05 VITALS — Ht 67.0 in | Wt 170.0 lb

## 2021-08-05 DIAGNOSIS — Z0000 Encounter for general adult medical examination without abnormal findings: Secondary | ICD-10-CM | POA: Diagnosis not present

## 2021-08-05 DIAGNOSIS — Z1231 Encounter for screening mammogram for malignant neoplasm of breast: Secondary | ICD-10-CM | POA: Diagnosis not present

## 2021-08-05 NOTE — Patient Instructions (Addendum)
Ms. Akens , Thank you for taking time to come for your Medicare Wellness Visit. I appreciate your ongoing commitment to your health goals. Please review the following plan we discussed and let me know if I can assist you in the future.   Screening recommendations/referrals: Colonoscopy: Due - call us for referral when you are ready or we can order at home Cologuard test Mammogram: Done 04/01/2020 - Repeat annually *due - call for appointment Bone Density: Due at age 80 Recommended yearly ophthalmology/optometry visit for glaucoma screening and checkup Recommended yearly dental visit for hygiene and checkup  Vaccinations: Influenza vaccine: Done 05/13/2021- Repeat annually  Pneumococcal vaccine: Due Tdap vaccine: Due Shingles vaccine: Due  Covid-19: Done 11/27/2019 & 12/27/2019 - due for booster  Advanced directives: Advance directive discussed with you today. I have provided a copy for you to complete at home and have notarized. Once this is complete please bring a copy in to our office so we can scan it into your chart.   Conditions/risks identified: Aim for 30 minutes of exercise or brisk walking each day, drink 6-8 glasses of water and eat lots of fruits and vegetables.   If you wish to quit smoking, help is available. For free tobacco cessation program offerings call the [facility] at 518-357-5937 or Live Well Line at 504-207-1573. You may also visit www.[facility].com or email livelifewell_0 .com for more information on other programs.   You may also call 1-800-QUIT-NOW 925-611-8749) or visit www.VirusCrisis.dk or www.BecomeAnEx.org for additional resources on smoking cessation.    Next appointment: Follow up in one year for your annual wellness visit.   Preventive Care 40-64 Years, Female Preventive care refers to lifestyle choices and visits with your health care provider that can promote health and wellness. What does preventive care include? A  yearly physical exam. This is also called an annual well check. Dental exams once or twice a year. Routine eye exams. Ask your health care provider how often you should have your eyes checked. Personal lifestyle choices, including: Daily care of your teeth and gums. Regular physical activity. Eating a healthy diet. Avoiding tobacco and drug use. Limiting alcohol use. Practicing safe sex. Taking low-dose aspirin daily starting at age 42. Taking vitamin and mineral supplements as recommended by your health care provider. What happens during an annual well check? The services and screenings done by your health care provider during your annual well check will depend on your age, overall health, lifestyle risk factors, and family history of disease. Counseling  Your health care provider may ask you questions about your: Alcohol use. Tobacco use. Drug use. Emotional well-being. Home and relationship well-being. Sexual activity. Eating habits. Work and work Statistician. Method of birth control. Menstrual cycle. Pregnancy history. Screening  You may have the following tests or measurements: Height, weight, and BMI. Blood pressure. Lipid and cholesterol levels. These may be checked every 5 years, or more frequently if you are over 52 years old. Skin check. Lung cancer screening. You may have this screening every year starting at age 53 if you have a 30-pack-year history of smoking and currently smoke or have quit within the past 15 years. Fecal occult blood test (FOBT) of the stool. You may have this test every year starting at age 69. Flexible sigmoidoscopy or colonoscopy. You may have a sigmoidoscopy every 5 years or a colonoscopy every 10 years starting at age 50. Hepatitis C blood test. Hepatitis B blood test. Sexually transmitted disease (STD) testing. Diabetes screening.  This is done by checking your blood sugar (glucose) after you have not eaten for a while (fasting). You may  have this done every 1-3 years. Mammogram. This may be done every 1-2 years. Talk to your health care provider about when you should start having regular mammograms. This may depend on whether you have a family history of breast cancer. BRCA-related cancer screening. This may be done if you have a family history of breast, ovarian, tubal, or peritoneal cancers. Pelvic exam and Pap test. This may be done every 3 years starting at age 40. Starting at age 52, this may be done every 5 years if you have a Pap test in combination with an HPV test. Bone density scan. This is done to screen for osteoporosis. You may have this scan if you are at high risk for osteoporosis. Discuss your test results, treatment options, and if necessary, the need for more tests with your health care provider. Vaccines  Your health care provider may recommend certain vaccines, such as: Influenza vaccine. This is recommended every year. Tetanus, diphtheria, and acellular pertussis (Tdap, Td) vaccine. You may need a Td booster every 10 years. Zoster vaccine. You may need this after age 42. Pneumococcal 13-valent conjugate (PCV13) vaccine. You may need this if you have certain conditions and were not previously vaccinated. Pneumococcal polysaccharide (PPSV23) vaccine. You may need one or two doses if you smoke cigarettes or if you have certain conditions. Talk to your health care provider about which screenings and vaccines you need and how often you need them. This information is not intended to replace advice given to you by your health care provider. Make sure you discuss any questions you have with your health care provider. Document Released: 08/16/2015 Document Revised: 04/08/2016 Document Reviewed: 05/21/2015 Elsevier Interactive Patient Education  2017 [place] Prevention in the Home Falls can cause injuries. They can happen to people of all ages. There are many things you can do to make your home safe  and to help prevent falls. What can I do on the outside of my home? Regularly fix the edges of walkways and driveways and fix any cracks. Remove anything that might make you trip as you walk through a door, such as a raised step or threshold. Trim any bushes or trees on the path to your home. Use bright outdoor lighting. Clear any walking paths of anything that might make someone trip, such as rocks or tools. Regularly check to see if handrails are loose or broken. Make sure that both sides of any steps have handrails. Any raised decks and porches should have guardrails on the edges. Have any leaves, snow, or ice cleared regularly. Use sand or salt on walking paths during winter. Clean up any spills in your garage right away. This includes oil or grease spills. What can I do in the bathroom? Use night lights. Install grab bars by the toilet and in the tub and shower. Do not use towel bars as grab bars. Use non-skid mats or decals in the tub or shower. If you need to sit down in the shower, use a plastic, non-slip stool. Keep the floor dry. Clean up any water that spills on the floor as soon as it happens. Remove soap buildup in the tub or shower regularly. Attach bath mats securely with double-sided non-slip rug tape. Do not have throw rugs and other things on the floor that can make you trip. What can I do in the bedroom?  Use night lights. Make sure that you have a light by your bed that is easy to reach. Do not use any sheets or blankets that are too big for your bed. They should not hang down onto the floor. Have a firm chair that has side arms. You can use this for support while you get dressed. Do not have throw rugs and other things on the floor that can make you trip. What can I do in the kitchen? Clean up any spills right away. Avoid walking on wet floors. Keep items that you use a lot in easy-to-reach places. If you need to reach something above you, use a strong step stool  that has a grab bar. Keep electrical cords out of the way. Do not use floor polish or wax that makes floors slippery. If you must use wax, use non-skid floor wax. Do not have throw rugs and other things on the floor that can make you trip. What can I do with my stairs? Do not leave any items on the stairs. Make sure that there are handrails on both sides of the stairs and use them. Fix handrails that are broken or loose. Make sure that handrails are as long as the stairways. Check any carpeting to make sure that it is firmly attached to the stairs. Fix any carpet that is loose or worn. Avoid having throw rugs at the top or bottom of the stairs. If you do have throw rugs, attach them to the floor with carpet tape. Make sure that you have a light switch at the top of the stairs and the bottom of the stairs. If you do not have them, ask someone to add them for you. What else can I do to help prevent falls? Wear shoes that: Do not have high heels. Have rubber bottoms. Are comfortable and fit you well. Are closed at the toe. Do not wear sandals. If you use a stepladder: Make sure that it is fully opened. Do not climb a closed stepladder. Make sure that both sides of the stepladder are locked into place. Ask someone to hold it for you, if possible. Clearly mark and make sure that you can see: Any grab bars or handrails. First and last steps. Where the edge of each step is. Use tools that help you move around (mobility aids) if they are needed. These include: Canes. Walkers. Scooters. Crutches. Turn on the lights when you go into a dark area. Replace any light bulbs as soon as they burn out. Set up your furniture so you have a clear path. Avoid moving your furniture around. If any of your floors are uneven, fix them. If there are any pets around you, be aware of where they are. Review your medicines with your doctor. Some medicines can make you feel dizzy. This can increase your chance of  falling. Ask your doctor what other things that you can do to help prevent falls. This information is not intended to replace advice given to you by your health care provider. Make sure you discuss any questions you have with your health care provider. Document Released: 05/16/2009 Document Revised: 12/26/2015 Document Reviewed: 08/24/2014 Elsevier Interactive Patient Education  2017 [employer].

## 2021-08-05 NOTE — Progress Notes (Signed)
Subjective:   Karen Serrano is a 56 y.o. female who presents for an Initial Medicare Annual Wellness Visit.  Virtual Visit via Telephone Note  I connected with  [place] on 08/05/21 at  4:15 PM EST by telephone and verified that I am speaking with the correct person using two identifiers.  Location: Patient: Home Provider: [facility] Persons participating in the virtual visit: patient/Nurse Health Advisor   I discussed the limitations, risks, security and privacy concerns of performing an evaluation and management service by telephone and the availability of in person appointments. The patient expressed understanding and agreed to proceed.  Interactive audio and video telecommunications were attempted between this nurse and patient, however failed, due to patient having technical difficulties OR patient did not have access to video capability.  We continued and completed visit with audio only.  Some vital signs may be absent or patient reported.   Arfa Lamarca E Aahna Rossa, LPN   Review of Systems     Cardiac Risk Factors include: sedentary lifestyle;dyslipidemia;hypertension     Objective:    Today's Vitals   08/05/21 1607  Weight: 170 lb (77.1 kg)  Height: 5\' 7"  (1.702 m)   Body mass index is 26.63 kg/m.  Advanced Directives 08/05/2021  Does Patient Have a Medical Advance Directive? No  Would patient like information on creating a medical advance directive? Yes (MAU/Ambulatory/Procedural Areas - Information given)    Current Medications (verified) Outpatient Encounter Medications as of 08/05/2021  Medication Sig   atorvastatin (LIPITOR) 80 MG tablet TAKE 1 TABLET BY MOUTH EVERY DAY   citalopram (CELEXA) 40 MG tablet TAKE 1 TABLET BY MOUTH EVERY DAY   lisinopril (ZESTRIL) 10 MG tablet TAKE 1 TABLET BY MOUTH EVERY DAY   meloxicam (MOBIC) 7.5 MG tablet TAKE 1 TABLET BY MOUTH EVERY DAY   Multiple Vitamin (MULTIVITAMIN WITH MINERALS) TABS Take 1 tablet by mouth daily.   No  facility-administered encounter medications on file as of 08/05/2021.    Allergies (verified) Sulfa antibiotics   History: Past Medical History:  Diagnosis Date   Depression    Hypertension    Past Surgical History:  Procedure Laterality Date   ABDOMINAL HYSTERECTOMY     ADENOIDECTOMY     BACK SURGERY     CESAREAN SECTION     EYE SURGERY     SPINE SURGERY     TONSILLECTOMY     Family History  Problem Relation Age of Onset   Cancer Mother    Social History   Socioeconomic History   Marital status: Significant Other    Spouse name: Carlena Hurl   Number of children: 1   Years of education: Not on file   Highest education level: Not on file  Occupational History   Occupation: disability    Comment: mental health  Tobacco Use   Smoking status: Some Days    Packs/day: 1.00    Years: 30.00    Pack years: 30.00    Types: Cigarettes   Smokeless tobacco: Never   Tobacco comments:    Has quit and started back several times - intermittent since age 57   Vaping Use   Vaping Use: Never used  Substance and Sexual Activity   Alcohol use: No   Drug use: No   Sexual activity: Not on file  Other Topics Concern   Not on file  Social History Narrative   Has one son - lives in [place]  Strain: Medium Risk   Difficulty of Paying Living Expenses: Somewhat hard  Food Insecurity: Food Insecurity Present   Worried About Charity fundraiser in the Last Year: Sometimes true   Ran Out of Food in the Last Year: Never true  Transportation Needs: No Transportation Needs   Lack of Transportation (Medical): No   Lack of Transportation (Non-Medical): No  Physical Activity: Insufficiently Active   Days of Exercise per Week: 4 days   Minutes of Exercise per Session: 30 min  Stress: Stress Concern Present   Feeling of Stress : To some extent  Social Connections: Moderately Isolated   Frequency of Communication with Friends and  Family: More than three times a week   Frequency of Social Gatherings with Friends and Family: Once a week   Attends Religious Services: Never   Marine scientist or Organizations: No   Attends Music therapist: Never   Marital Status: Living with partner    Tobacco Counseling Ready to quit: Yes Counseling given: Yes Tobacco comments: Has quit and started back several times - intermittent since age 46    Clinical Intake:  Pre-visit preparation completed: Yes  Pain : No/denies pain     BMI - recorded: 26.63 Nutritional Status: BMI 25 -29 Overweight Nutritional Risks: None Diabetes: No  How often do you need to have someone help you when you read instructions, pamphlets, or other written materials from your doctor or pharmacy?: 1 - Never  Diabetic? no  Interpreter Needed?: No  Information entered by :: Pratik Dalziel, LPN   Activities of Daily Living In your present state of health, do you have any difficulty performing the following activities: 08/05/2021  Hearing? N  Vision? N  Difficulty concentrating or making decisions? N  Walking or climbing stairs? N  Dressing or bathing? N  Doing errands, shopping? N  Preparing Food and eating ? N  Using the Toilet? N  In the past six months, have you accidently leaked urine? N  Do you have problems with loss of bowel control? N  Managing your Medications? N  Managing your Finances? N  Housekeeping or managing your Housekeeping? Y  Comment limited due to back pain - her boyfriend helps  Some recent data might be hidden    Patient Care Team: Chevis Pretty, FNP as PCP - General (Family Medicine)  Indicate any recent Medical Services you may have received from other than [facility] providers in the past year (date may be approximate).     Assessment:   This is a routine wellness examination for Karen Serrano.  Hearing/Vision screen Hearing Screening - Comments:: Denies hearing difficulties  Vision Screening  - Comments:: Wears contact lenses - had eye exams about a year ago in [place], [place] - no regular eye doctor  Dietary issues and exercise activities discussed: Current Exercise Habits: Home exercise routine, Type of exercise: walking, Time (Minutes): 30, Frequency (Times/Week): 4, Weekly Exercise (Minutes/Week): 120, Intensity: Mild, Exercise limited by: orthopedic condition(s)   Goals Addressed             This Visit's Progress    Exercise 150 min/wk Moderate Activity       Quit Smoking       Some days she doesn't smoke at all, other days she smokes 1 pack per day - she is trying to quit - anxiety contributes - she will attempt to cut back, chew gum, meditated, etc.       Depression Screen [facility] 2/9 Scores 08/05/2021 04/01/2021  09/26/2020 02/27/2020 11/23/2019 06/19/2019 03/17/2019  PHQ - 2 Score 2 2 0 0 0 0 0  PHQ- 9 Score 4 4 - - - - -    Fall Risk Fall Risk  08/05/2021 04/01/2021 02/27/2020 11/23/2019 06/19/2019  Falls in the past year? 0 0 0 0 0  Number falls in past yr: 0 - - - -  Injury with Fall? 0 - - - -  Risk for fall due to : Orthopedic patient - - - -  Follow up Falls prevention discussed - - - -    FALL RISK PREVENTION PERTAINING TO THE HOME:  Any stairs in or around the home? No  If so, are there any without handrails? No  Home free of loose throw rugs in walkways, pet beds, electrical cords, etc? Yes  Adequate lighting in your home to reduce risk of falls? Yes   ASSISTIVE DEVICES UTILIZED TO PREVENT FALLS:  Life alert? No  Use of a cane, walker or w/c? No  Grab bars in the bathroom? No  Shower chair or bench in shower? No  Elevated toilet seat or a handicapped toilet? No   TIMED UP AND GO:  Was the test performed? No . Telephonic visit  Cognitive Function:     6CIT Screen 08/05/2021  What Year? 0 points  What month? 0 points  What time? 0 points  Count back from 20 0 points  Months in reverse 0 points  Repeat phrase 0 points  Total Score 0     Immunizations Immunization History  Administered Date(s) Administered   Influenza,inj,Quad PF,6+ Mos 05/24/2013, 04/21/2016, 09/26/2020, 05/13/2021   Influenza-Unspecified 06/15/2014   Moderna Sars-Covid-2 Vaccination 11/27/2019, 12/27/2019    TDAP status: Due, Education has been provided regarding the importance of this vaccine. Advised may receive this vaccine at local pharmacy or Health Dept. Aware to provide a copy of the vaccination record if obtained from local pharmacy or Health Dept. Verbalized acceptance and understanding.  Flu Vaccine status: Up to date  Pneumococcal vaccine status: Due, Education has been provided regarding the importance of this vaccine. Advised may receive this vaccine at local pharmacy or Health Dept. Aware to provide a copy of the vaccination record if obtained from local pharmacy or Health Dept. Verbalized acceptance and understanding.  Covid-19 vaccine status: Information provided on how to obtain vaccines.   Qualifies for Shingles Vaccine? Yes   Zostavax completed No   Shingrix Completed?: No.    Education has been provided regarding the importance of this vaccine. Patient has been advised to call insurance company to determine out of pocket expense if they have not yet received this vaccine. Advised may also receive vaccine at local pharmacy or Health Dept. Verbalized acceptance and understanding.  Screening Tests Health Maintenance  Topic Date Due   PAP SMEAR-Modifier  Never done   Zoster Vaccines- Shingrix (1 of 2) Never done   COVID-19 Vaccine (3 - Booster for Moderna series) 02/21/2020   COLONOSCOPY (Pts 45-62yrs Insurance coverage will need to be confirmed)  09/26/2021 (Originally 04/26/2012)   TETANUS/TDAP  09/26/2021 (Originally 04/26/1986)   Hepatitis C Screening  09/26/2021 (Originally 04/26/1985)   HIV Screening  09/26/2021 (Originally 04/26/1982)   Pneumococcal Vaccine 97-3 Years old (1 - PCV) 04/01/2022 (Originally 04/26/1973)    MAMMOGRAM  02/27/2022   INFLUENZA VACCINE  Completed   HPV VACCINES  Aged Out    Health Maintenance  Health Maintenance Due  Topic Date Due   PAP SMEAR-Modifier  Never done   Zoster  Vaccines- Shingrix (1 of 2) Never done   COVID-19 Vaccine (3 - Booster for Moderna series) 02/21/2020    Colonoscopy declined  Mammogram status: Ordered 08/05/2021. Pt provided with contact info and advised to call to schedule appt.   Bone Density scan due at age 45  Lung Cancer Screening: (Low Dose CT Chest recommended if Age 36-80 years, 30 pack-year currently smoking OR have quit w/in 15years.) does qualify.   Lung Cancer Screening Referral: declined  Additional Screening:  Hepatitis C Screening: does qualify; Declined  Vision Screening: Recommended annual ophthalmology exams for early detection of glaucoma and other disorders of the eye. Is the patient up to date with their annual eye exam?  Yes  Who is the provider or what is the name of the office in which the patient attends annual eye exams? [facility] in [place] If pt is not established with a provider, would they like to be referred to a provider to establish care? No .   Dental Screening: Recommended annual dental exams for proper oral hygiene  Community Resource Referral / Chronic Care Management: CRR required this visit?  No   CCM required this visit?  No      Plan:     I have personally reviewed and noted the following in the patients chart:   Medical and social history Use of alcohol, tobacco or illicit drugs  Current medications and supplements including opioid prescriptions. Patient is not currently taking opioid prescriptions. Functional ability and status Nutritional status Physical activity Advanced directives List of other physicians Hospitalizations, surgeries, and ER visits in previous 12 months Vitals Screenings to include cognitive, depression, and falls Referrals and appointments  In addition, I have reviewed  and discussed with patient certain preventive protocols, quality metrics, and best practice recommendations. A written personalized care plan for preventive services as well as general preventive health recommendations were provided to patient.     Sandrea Hammond, LPN   [id number]   Nurse Notes: Declined screenings - says her mother died of lung cancer and she doesn't want to know if she is dying.

## 2021-10-09 ENCOUNTER — Ambulatory Visit: Payer: Medicare Other | Admitting: Nurse Practitioner

## 2021-10-27 ENCOUNTER — Ambulatory Visit
Admission: RE | Admit: 2021-10-27 | Discharge: 2021-10-27 | Disposition: A | Discharge status: Home / Self Care | Payer: Medicare Other | Source: Ambulatory Visit | Attending: Nurse Practitioner | Admitting: Nurse Practitioner

## 2021-10-27 ENCOUNTER — Other Ambulatory Visit: Payer: Self-pay

## 2021-10-27 ENCOUNTER — Encounter: Payer: Self-pay | Admitting: Nurse Practitioner

## 2021-10-27 ENCOUNTER — Ambulatory Visit (INDEPENDENT_AMBULATORY_CARE_PROVIDER_SITE_OTHER): Payer: Medicare Other | Admitting: Nurse Practitioner

## 2021-10-27 VITALS — BP 85/58 | HR 119 | Temp 97.5°F | Resp 20 | Ht 67.0 in | Wt 177.0 lb

## 2021-10-27 DIAGNOSIS — I10 Essential (primary) hypertension: Secondary | ICD-10-CM

## 2021-10-27 DIAGNOSIS — F3130 Bipolar disorder, current episode depressed, mild or moderate severity, unspecified: Secondary | ICD-10-CM | POA: Diagnosis not present

## 2021-10-27 DIAGNOSIS — E7800 Pure hypercholesterolemia, unspecified: Secondary | ICD-10-CM | POA: Diagnosis not present

## 2021-10-27 DIAGNOSIS — Z6828 Body mass index (BMI) 28.0-28.9, adult: Secondary | ICD-10-CM

## 2021-10-27 DIAGNOSIS — Z23 Encounter for immunization: Secondary | ICD-10-CM

## 2021-10-27 DIAGNOSIS — Z1231 Encounter for screening mammogram for malignant neoplasm of breast: Secondary | ICD-10-CM

## 2021-10-27 MED ORDER — LISINOPRIL 10 MG PO TABS: 10.0000 mg | ORAL_TABLET | Freq: Every day | ORAL | 1 refills | Status: DC

## 2021-10-27 MED ORDER — CITALOPRAM HYDROBROMIDE 40 MG PO TABS: ORAL_TABLET | ORAL | 1 refills | Status: DC

## 2021-10-27 MED ORDER — ATORVASTATIN CALCIUM 80 MG PO TABS: 80.0000 mg | ORAL_TABLET | Freq: Every day | ORAL | 1 refills | Status: DC

## 2021-10-27 NOTE — Addendum Note (Signed)
Addended by: Rolena Infante on: 10/27/2021 04:45 PM ? ? Modules accepted: Orders ? ?

## 2021-10-27 NOTE — Patient Instructions (Signed)
Stress, Adult °Stress is a normal reaction to life events. Stress is what you feel when life demands more than you are used to, or more than you think you can handle. °Some stress can be useful, such as studying for a test or meeting a deadline at work. Stress that occurs too often or for too long can cause problems. Long-lasting stress is called chronic stress. Chronic stress can affect your emotional health and interfere with relationships and normal daily activities. °Too much stress can weaken your body's defense system (immune system) and increase your risk for physical illness. If you already have a medical problem, stress can make it worse. °What are the causes? °All sorts of life events can cause stress. An event that causes stress for one person may not be stressful for someone else. Major life events, whether positive or negative, commonly cause stress. Examples include: °Losing a job or starting a new job. °Losing a loved one. °Moving to a new town or home. °Getting married or divorced. °Having a baby. °Getting injured or sick. °Less obvious life events can also cause stress, especially if they occur day after day or in combination with each other. Examples include: °Working long hours. °Driving in traffic. °Caring for children. °Being in debt. °Being in a difficult relationship. °What are the signs or symptoms? °Stress can cause emotional and physical symptoms and can lead to unhealthy behaviors. These include the following: °Emotional symptoms °Anxiety. This is feeling worried, afraid, on edge, overwhelmed, or out of control. °Anger, including irritation or impatience. °Depression. This is feeling sad, down, helpless, or guilty. °Trouble focusing, remembering, or making decisions. °Physical symptoms °Aches and pains. These may affect your head, neck, back, stomach, or other areas of your body. °Tight muscles or a clenched jaw. °Low energy. °Trouble sleeping. °Unhealthy behaviors °Eating to feel better  (overeating) or skipping meals. °Working too much or putting off tasks. °Smoking, drinking alcohol, or using drugs to feel better. °How is this diagnosed? °A stress disorder is diagnosed through an assessment by your health care provider. A stress disorder may be diagnosed based on: °Your symptoms and any stressful life events. °Your medical history. °Tests to rule out other causes of your symptoms. °Depending on your condition, your health care provider may refer you to a specialist for further evaluation. °How is this treated? °Stress management techniques are the recommended treatment for stress. Medicine is not typically recommended for treating stress. °Techniques to reduce your reaction to stressful life events include: °Identifying stress. Monitor yourself for symptoms of stress and notice what causes stress for you. These skills may help you to avoid or prepare for stressful events. °Managing time. Set your priorities, keep a calendar of events, and learn to say no. These actions can help you avoid taking on too much. °Techniques for dealing with stress include: °Rethinking the problem. Try to think realistically about stressful events rather than ignoring them or overreacting. Try to find the positives in a stressful situation rather than focusing on the negatives. °Exercise. Physical exercise can release both physical and emotional tension. The key is to find a form of exercise that you enjoy and do it regularly. °Relaxation techniques. These relax the body and mind. Find one or more that you enjoy and use the techniques regularly. Examples include: °Meditation, deep breathing, or progressive relaxation techniques. °Yoga or tai chi. °Biofeedback, mindfulness techniques, or journaling. °Listening to music, being in nature, or taking part in other hobbies. °Practicing a healthy lifestyle. Eat a balanced diet,   drink plenty of water, limit or avoid caffeine, and get plenty of sleep. °Having a strong support  network. Spend time with family, friends, or other people you enjoy being around. Express your feelings and talk things over with someone you trust. °Counseling or talk therapy with a mental health provider may help if you are having trouble managing stress by yourself. °Follow these instructions at home: °Lifestyle ° °Avoid drugs. °Do not use any products that contain nicotine or tobacco. These products include cigarettes, chewing tobacco, and vaping devices, such as e-cigarettes. If you need help quitting, ask your health care provider. °If you drink alcohol: °Limit how much you have to: °0-1 drink a day for women who are not pregnant. °0-2 drinks a day for men. °Know how much alcohol is in a drink. In the [place] one drink equals one 12 oz bottle of beer (355 mL), one 5 oz glass of wine (148 mL), or one 1½ oz glass of hard liquor (44 mL). °Do not use alcohol or drugs to relax. °Eat a balanced diet that includes fresh fruits and vegetables, whole grains, lean meats, fish, eggs, beans, and low-fat dairy. Avoid processed foods and foods high in added fat, sugar, and salt. °Exercise at least 30 minutes on 5 or more days each week. °Get 7-8 hours of sleep each night. °General instructions ° °Practice stress management techniques as told by your health care provider. °Drink enough fluid to keep your urine pale yellow. °Take over-the-counter and prescription medicines only as told by your health care provider. °Keep all follow-up visits. This is important. °Contact a health care provider if: °Your symptoms get worse. °You have new symptoms. °You feel overwhelmed by your problems and can no longer manage them by yourself. °Get help right away if: °You have thoughts of hurting yourself or others. °Get help right awayif you feel like you may hurt yourself or others, or have thoughts about taking your own life. Go to your nearest emergency room or: °Call 911. °Call the National Suicide Prevention Lifeline at 1-800-273-8255 or  988. This is open 24 hours a day. °Text the Crisis Text Line at 741741. °Summary °Stress is a normal reaction to life events. It can cause problems if it happens too often or for too long. °Practicing stress management techniques is the best way to treat stress. °Counseling or talk therapy with a mental health provider may help if you are having trouble managing stress by yourself. °This information is not intended to replace advice given to you by your health care provider. Make sure you discuss any questions you have with your health care provider. °Document Revised: 02/27/2021 Document Reviewed: 02/27/2021 °Elsevier Patient Education © 2022 [employer] ° °

## 2021-10-27 NOTE — Progress Notes (Signed)
? ?Subjective:  ? ? Patient ID: Karen Serrano, female    DOB: July 22, 1966, 56 y.o.   MRN: [id number] ? ? ?Chief Complaint: medical management of chronic issues  ? ?  ? ?HPI: ? ?Karen Serrano is a 56 y.o. who identifies as a female who was assigned female at birth.  ? ?Social history: ?Lives with: husband in [place], they are taking care of her mother in law ?Work history: disability ? ? ?Comes in today for follow up of the following chronic medical issues: ? ?1. Primary hypertension ?No c/o chest pan sob or headache. She doe snot check her blood pressure at home. ?BP Readings from Last 3 Encounters:  ?10/27/21 (!) 85/58  ?04/01/21 (!) 139/94  ?09/26/20 93/62  ? ? ? ? ?2. Pure hypercholesterolemia ?Does try to watch diet ut does no dedicate exercise. ?Lab Results  ?Component Value Date  ? CHOL 159 04/01/2021  ? HDL 50 04/01/2021  ? [place] 88 04/01/2021  ? TRIG 115 04/01/2021  ? CHOLHDL 3.2 04/01/2021  ?The ASCVD Risk score (Arnett DK, et al., 2019) failed to calculate for the following reasons: ?  The valid systolic blood pressure range is 90 to 200 mmHg ? ? ? ?3. Bipolar I disorder, most recent episode depressed ([place]) ?Is on celexa and says that she has been doing well. She has good times an bad times and today she is a little down. ? ?  10/27/2021  ?  3:23 PM 08/05/2021  ?  4:22 PM 04/01/2021  ?  2:58 PM  ?Depression screen PHQ 2/9  ?Decreased Interest '1 1 2  ' ?Down, Depressed, Hopeless 2 1 0  ?PHQ - 2 Score '3 2 2  ' ?Altered sleeping 3 0 0  ?Tired, decreased energy '2 1 1  ' ?Change in appetite 3 0 0  ?Feeling bad or failure about yourself  3 0 0  ?Trouble concentrating '3 1 1  ' ?Moving slowly or fidgety/restless 3 0 0  ?Suicidal thoughts 0 0 0  ?PHQ-9 Score '20 4 4  ' ?Difficult doing work/chores Not difficult at all Somewhat difficult Not difficult at all  ? ? ? ?4. BMI 28.0-28.9,adult ?Weight is up 7lbs ?Wt Readings from Last 3 Encounters:  ?10/27/21 177 lb (80.3 kg)  ?08/05/21 170 lb (77.1 kg)  ?04/01/21 185 lb  (83.9 kg)  ? ?BMI Readings from Last 3 Encounters:  ?10/27/21 27.72 kg/m?  ?08/05/21 26.63 kg/m?  ?04/01/21 28.98 kg/m?  ? ? ? ?New complaints: ?None today ? ?Allergies  ?Allergen Reactions  ? Sulfa Antibiotics Other (See Comments)  ?  Unknown  ? ?Outpatient Encounter Medications as of 10/27/2021  ?Medication Sig  ? atorvastatin (LIPITOR) 80 MG tablet TAKE 1 TABLET BY MOUTH EVERY DAY  ? citalopram (CELEXA) 40 MG tablet TAKE 1 TABLET BY MOUTH EVERY DAY  ? lisinopril (ZESTRIL) 10 MG tablet TAKE 1 TABLET BY MOUTH EVERY DAY  ? meloxicam (MOBIC) 7.5 MG tablet TAKE 1 TABLET BY MOUTH EVERY DAY  ? Multiple Vitamin (MULTIVITAMIN WITH MINERALS) TABS Take 1 tablet by mouth daily.  ? ?No facility-administered encounter medications on file as of 10/27/2021.  ? ? ?Past Surgical History:  ?Procedure Laterality Date  ? ABDOMINAL HYSTERECTOMY    ? ADENOIDECTOMY    ? BACK SURGERY    ? CESAREAN SECTION    ? EYE SURGERY    ? SPINE SURGERY    ? TONSILLECTOMY    ? ? ?Family History  ?Problem Relation Age of Onset  ? Cancer Mother   ? ? ? ?  Controlled substance contract: n/a ? ? ? ? ?Review of Systems  ?Constitutional:  Negative for diaphoresis.  ?Eyes:  Negative for pain.  ?Respiratory:  Negative for shortness of breath.   ?Cardiovascular:  Negative for chest pain, palpitations and leg swelling.  ?Gastrointestinal:  Negative for abdominal pain.  ?Endocrine: Negative for polydipsia.  ?Skin:  Negative for rash.  ?Neurological:  Negative for dizziness, weakness and headaches.  ?Hematological:  Does not bruise/bleed easily.  ?All other systems reviewed and are negative. ? ?   ?Objective:  ? Physical Exam ?Vitals and nursing note reviewed.  ?Constitutional:   ?   General: She is not in acute distress. ?   Appearance: Normal appearance. She is well-developed.  ?HENT:  ?   Head: Normocephalic.  ?   Right Ear: Tympanic membrane normal.  ?   Left Ear: Tympanic membrane normal.  ?   Nose: Nose normal.  ?   Mouth/Throat:  ?   Mouth: Mucous membranes  are moist.  ?Eyes:  ?   Pupils: Pupils are equal, round, and reactive to light.  ?Neck:  ?   Vascular: No carotid bruit or JVD.  ?Cardiovascular:  ?   Rate and Rhythm: Normal rate and regular rhythm.  ?   Heart sounds: Normal heart sounds.  ?Pulmonary:  ?   Effort: Pulmonary effort is normal. No respiratory distress.  ?   Breath sounds: Normal breath sounds. No wheezing or rales.  ?Chest:  ?   Chest wall: No tenderness.  ?Abdominal:  ?   General: Bowel sounds are normal. There is no distension or abdominal bruit.  ?   Palpations: Abdomen is soft. There is no hepatomegaly, splenomegaly, mass or pulsatile mass.  ?   Tenderness: There is no abdominal tenderness.  ?Musculoskeletal:     ?   General: Normal range of motion.  ?   Cervical back: Normal range of motion and neck supple.  ?Lymphadenopathy:  ?   Cervical: No cervical adenopathy.  ?Skin: ?   General: Skin is warm and dry.  ?Neurological:  ?   Mental Status: She is alert and oriented to person, place, and time.  ?   Deep Tendon Reflexes: Reflexes are normal and symmetric.  ?Psychiatric:     ?   Behavior: Behavior normal.     ?   Thought Content: Thought content normal.     ?   Judgment: Judgment normal.  ? ? ?BP (!) 85/58   Pulse (!) 119   Temp (!) 97.5 ?F (36.4 ?C) (Temporal)   Resp 20   Ht '5\' 7"'  (1.702 m)   Wt 177 lb (80.3 kg)   SpO2 90%   BMI 27.72 kg/m?  ? ? ? ? ?   ?Assessment & Plan:  ?Jasiyah A Vetter comes in today with chief complaint of Medical Management of Chronic Issues ? ? ?Diagnosis and orders addressed: ? ?1. Primary hypertension ?Low sodium diet ?- CBC with Differential/Platelet ?- CMP14+EGFR ? ?2. Pure hypercholesterolemia ?Low fat diet ?- atorvastatin (LIPITOR) 80 MG tablet; Take 1 tablet (80 mg total) by mouth daily.  Dispense: 90 tablet; Refill: 1 ?- lisinopril (ZESTRIL) 10 MG tablet; Take 1 tablet (10 mg total) by mouth daily.  Dispense: 90 tablet; Refill: 1 ?- Lipid panel ? ?3. Bipolar I disorder, most recent episode depressed  ([place]) ?Continue celexa- patient doe snot want to change meds ?- citalopram (CELEXA) 40 MG tablet; TAKE 1 TABLET BY MOUTH EVERY DAY  Dispense: 90 tablet; Refill: 1 ? ?4.  BMI 28.0-28.9,adult ?Discussed diet and exercise for person with BMI >25 ?Will recheck weight in 3-6 months ? ? ? ?Labs pending ?Health Maintenance reviewed ?Diet and exercise encouraged ? ?Follow up plan: ?6 months ? ? ?Mary-Margaret Hassell Done, FNP ? ? ?

## 2021-10-28 LAB — LIPID PANEL
Chol/HDL Ratio: 3.9 ratio (ref 0.0–4.4)
Cholesterol, Total: 162 mg/dL (ref 100–199)
HDL: 42 mg/dL (ref >39)
LDL Chol Calc (NIH): 86 mg/dL (ref 0–99)
Triglycerides: 201 mg/dL — ABNORMAL HIGH (ref 0–149)
VLDL Cholesterol Cal: 34 mg/dL (ref 5–40)

## 2021-10-28 LAB — CMP14+EGFR
ALT: 25 IU/L (ref 0–32)
AST: 18 IU/L (ref 0–40)
Albumin/Globulin Ratio: 1.8 (ref 1.2–2.2)
Albumin: 4.2 g/dL (ref 3.8–4.9)
Alkaline Phosphatase: 113 IU/L (ref 44–121)
BUN/Creatinine Ratio: 15 (ref 9–23)
BUN: 13 mg/dL (ref 6–24)
Bilirubin Total: 0.2 mg/dL (ref 0.0–1.2)
CO2: 25 mmol/L (ref 20–29)
Calcium: 9.5 mg/dL (ref 8.7–10.2)
Chloride: 99 mmol/L (ref 96–106)
Creatinine, Ser: 0.88 mg/dL (ref 0.57–1.00)
Globulin, Total: 2.3 g/dL (ref 1.5–4.5)
Glucose: 93 mg/dL (ref 70–99)
Potassium: 4.9 mmol/L (ref 3.5–5.2)
Sodium: 137 mmol/L (ref 134–144)
Total Protein: 6.5 g/dL (ref 6.0–8.5)
eGFR: 78 mL/min/{1.73_m2} (ref >59)

## 2021-10-28 LAB — CBC WITH DIFFERENTIAL/PLATELET
Basophils Absolute: 0.1 10*3/uL (ref 0.0–0.2)
Basos: 1 %
EOS (ABSOLUTE): 0.4 10*3/uL (ref 0.0–0.4)
Eos: 4 %
Hematocrit: 41.6 % (ref 34.0–46.6)
Hemoglobin: 13.8 g/dL (ref 11.1–15.9)
Immature Grans (Abs): 0 10*3/uL (ref 0.0–0.1)
Immature Granulocytes: 0 %
Lymphocytes Absolute: 3.2 10*3/uL — ABNORMAL HIGH (ref 0.7–3.1)
Lymphs: 36 %
MCH: 30.3 pg (ref 26.6–33.0)
MCHC: 33.2 g/dL (ref 31.5–35.7)
MCV: 91 fL (ref 79–97)
Monocytes Absolute: 1 10*3/uL — ABNORMAL HIGH (ref 0.1–0.9)
Monocytes: 12 %
Neutrophils Absolute: 4.1 10*3/uL (ref 1.4–7.0)
Neutrophils: 47 %
Platelets: 287 10*3/uL (ref 150–450)
RBC: 4.56 x10E6/uL (ref 3.77–5.28)
RDW: 12.8 % (ref 11.7–15.4)
WBC: 8.8 10*3/uL (ref 3.4–10.8)

## 2021-11-08 ENCOUNTER — Other Ambulatory Visit: Payer: Self-pay | Admitting: Nurse Practitioner

## 2021-11-08 DIAGNOSIS — F3130 Bipolar disorder, current episode depressed, mild or moderate severity, unspecified: Secondary | ICD-10-CM

## 2021-12-23 ENCOUNTER — Other Ambulatory Visit: Payer: Self-pay | Admitting: Nurse Practitioner

## 2021-12-23 DIAGNOSIS — E7800 Pure hypercholesterolemia, unspecified: Secondary | ICD-10-CM

## 2022-03-20 ENCOUNTER — Other Ambulatory Visit: Payer: Self-pay | Admitting: Nurse Practitioner

## 2022-03-20 DIAGNOSIS — E7800 Pure hypercholesterolemia, unspecified: Secondary | ICD-10-CM

## 2022-03-29 ENCOUNTER — Other Ambulatory Visit: Payer: Self-pay | Admitting: Nurse Practitioner

## 2022-03-29 DIAGNOSIS — E7800 Pure hypercholesterolemia, unspecified: Secondary | ICD-10-CM

## 2022-04-08 ENCOUNTER — Other Ambulatory Visit: Payer: Self-pay | Admitting: Nurse Practitioner

## 2022-04-08 DIAGNOSIS — M7552 Bursitis of left shoulder: Secondary | ICD-10-CM

## 2022-04-08 DIAGNOSIS — E7800 Pure hypercholesterolemia, unspecified: Secondary | ICD-10-CM

## 2022-04-10 ENCOUNTER — Other Ambulatory Visit: Payer: Self-pay | Admitting: Nurse Practitioner

## 2022-04-10 DIAGNOSIS — M7552 Bursitis of left shoulder: Secondary | ICD-10-CM

## 2022-04-13 ENCOUNTER — Other Ambulatory Visit: Payer: Self-pay | Admitting: Nurse Practitioner

## 2022-04-13 DIAGNOSIS — E7800 Pure hypercholesterolemia, unspecified: Secondary | ICD-10-CM

## 2022-04-15 MED ORDER — LISINOPRIL 10 MG PO TABS: 10.0000 mg | ORAL_TABLET | Freq: Every day | ORAL | 0 refills | Status: AC

## 2022-04-15 NOTE — Addendum Note (Signed)
Addended by: Julious Payer D on: 04/15/2022 08:23 AM   Modules accepted: Orders

## 2022-04-15 NOTE — Telephone Encounter (Signed)
Refill failed. resent °

## 2022-04-30 ENCOUNTER — Encounter: Payer: Medicare Other | Admitting: Nurse Practitioner

## 2022-05-01 ENCOUNTER — Other Ambulatory Visit: Payer: Self-pay | Admitting: Nurse Practitioner

## 2022-05-01 DIAGNOSIS — F3130 Bipolar disorder, current episode depressed, mild or moderate severity, unspecified: Secondary | ICD-10-CM

## 2022-05-11 ENCOUNTER — Other Ambulatory Visit: Payer: Self-pay | Admitting: Nurse Practitioner

## 2022-05-11 DIAGNOSIS — M7552 Bursitis of left shoulder: Secondary | ICD-10-CM

## 2022-06-12 ENCOUNTER — Other Ambulatory Visit: Payer: Self-pay | Admitting: Nurse Practitioner

## 2022-06-12 DIAGNOSIS — M7552 Bursitis of left shoulder: Secondary | ICD-10-CM

## 2022-07-09 ENCOUNTER — Other Ambulatory Visit: Payer: Self-pay | Admitting: Nurse Practitioner

## 2022-07-09 DIAGNOSIS — F3130 Bipolar disorder, current episode depressed, mild or moderate severity, unspecified: Secondary | ICD-10-CM

## 2022-07-09 DIAGNOSIS — E7800 Pure hypercholesterolemia, unspecified: Secondary | ICD-10-CM

## 2022-09-10 ENCOUNTER — Other Ambulatory Visit: Payer: Self-pay | Admitting: Nurse Practitioner

## 2022-09-10 DIAGNOSIS — E7800 Pure hypercholesterolemia, unspecified: Secondary | ICD-10-CM

## 2024-01-20 IMAGING — MG MM DIGITAL SCREENING BILAT W/ TOMO AND CAD
6 of 10 series · 6 of 30 positions shown · non-contrast
Comparison: Previous exam(s).

CLINICAL DATA: Screening.

EXAM:
DIGITAL SCREENING BILATERAL MAMMOGRAM WITH TOMOSYNTHESIS AND CAD
TECHNIQUE: Bilateral screening digital craniocaudal and mediolateral oblique
mammograms were obtained. Bilateral screening digital breast
tomosynthesis was performed. The images were evaluated with
computer-aided detection.

[L CC synth-2D]
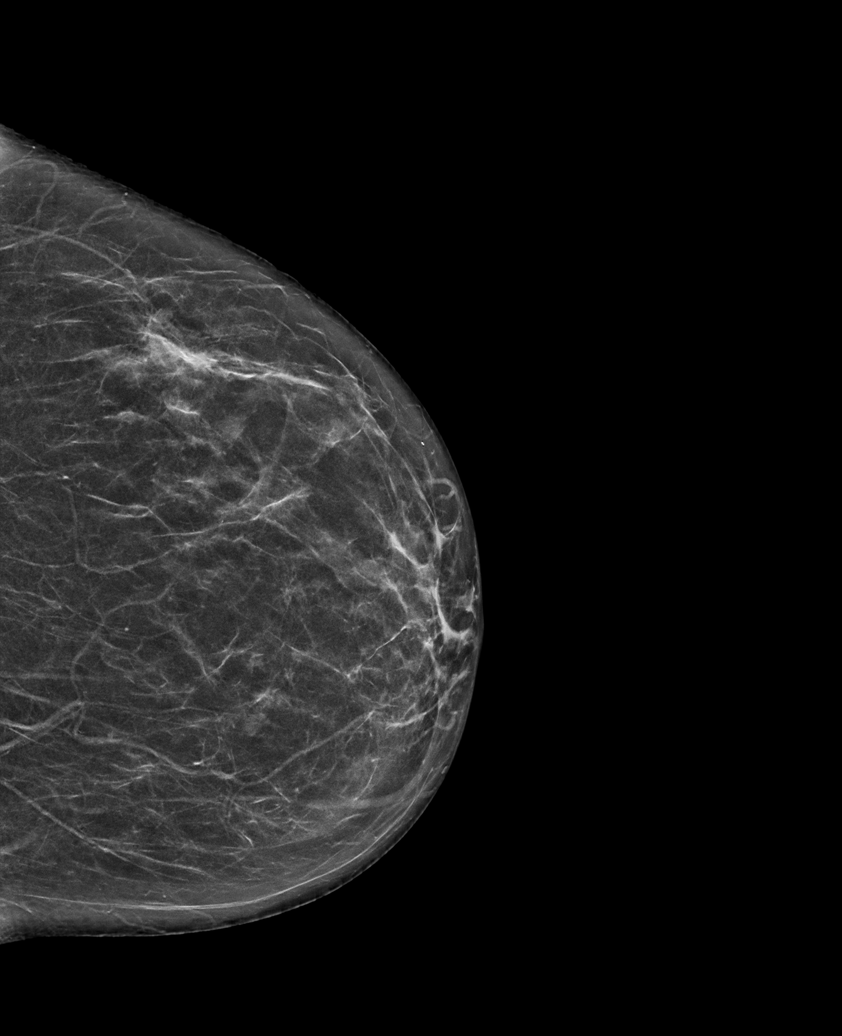

[R MLO synth-2D]
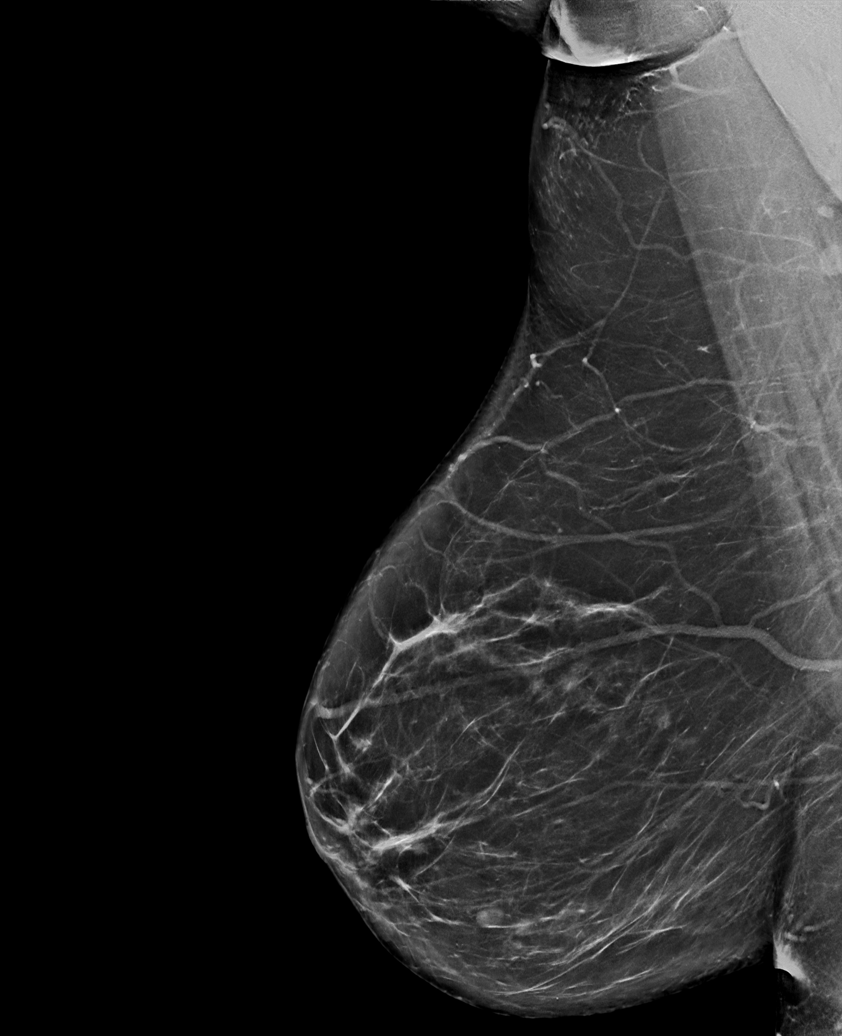

[L MLO synth-2D (1 of 2)]
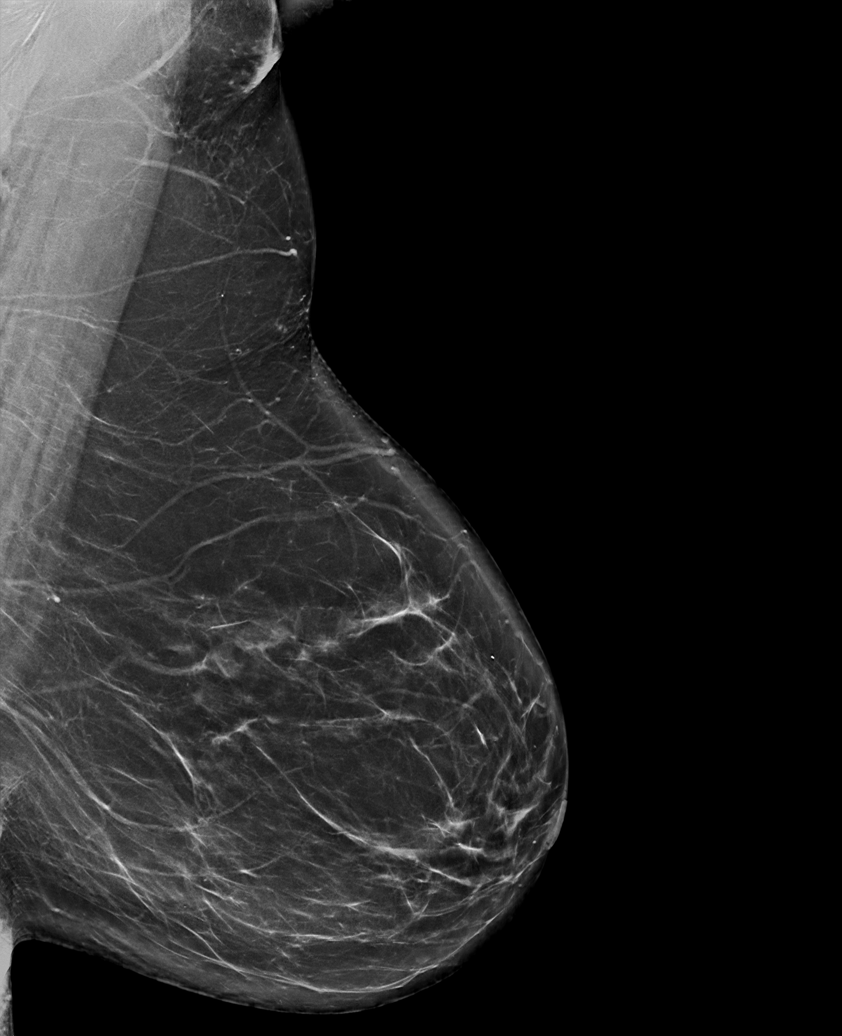

[R CC synth-2D]
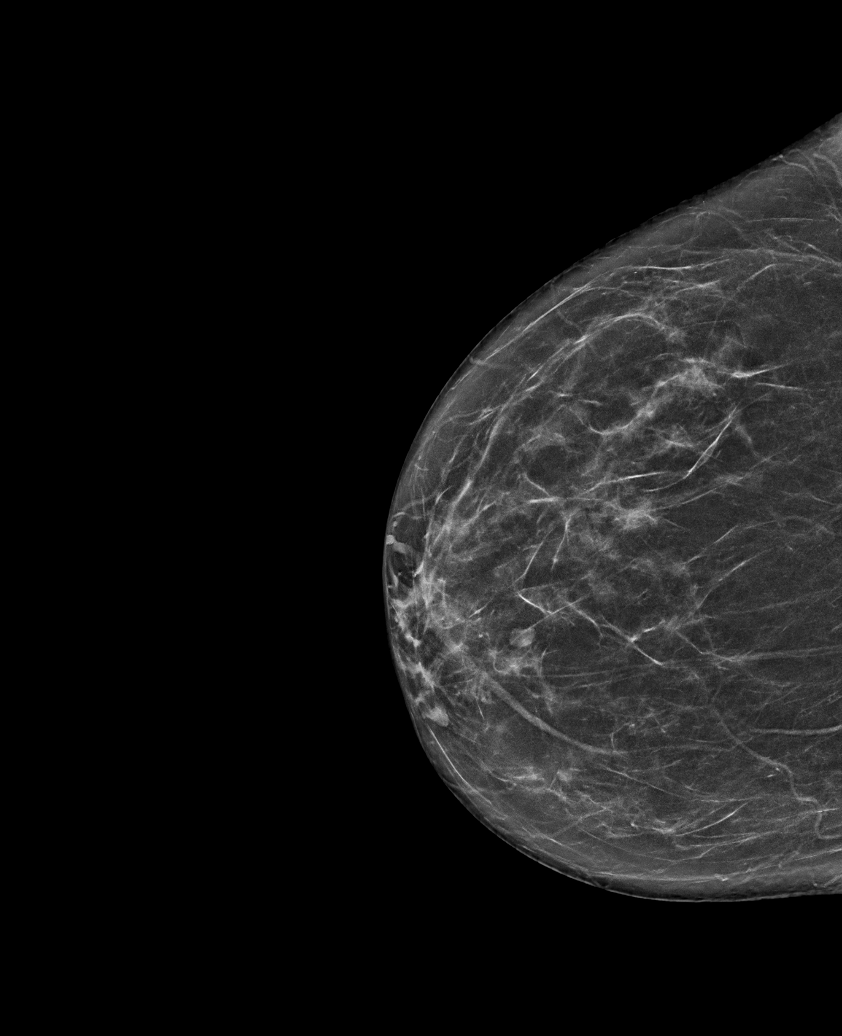

[L MLO synth-2D (2 of 2)]
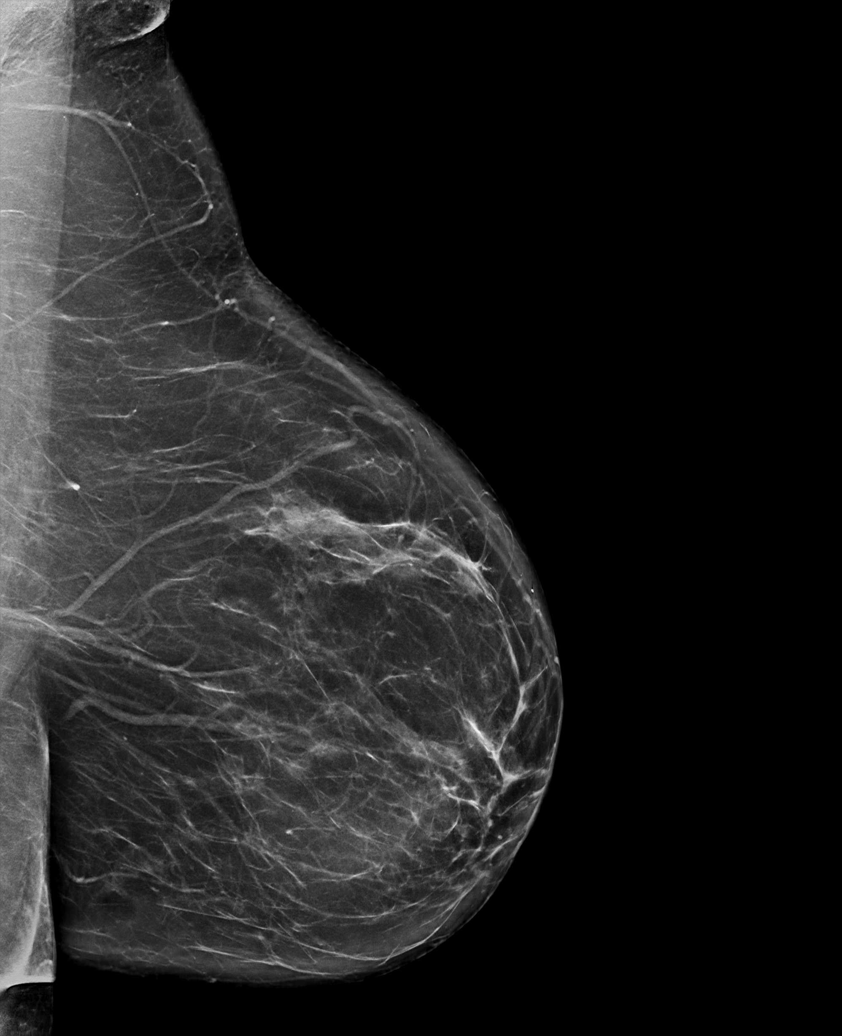

[L MLO tomo · tomo slice 41/81.0]
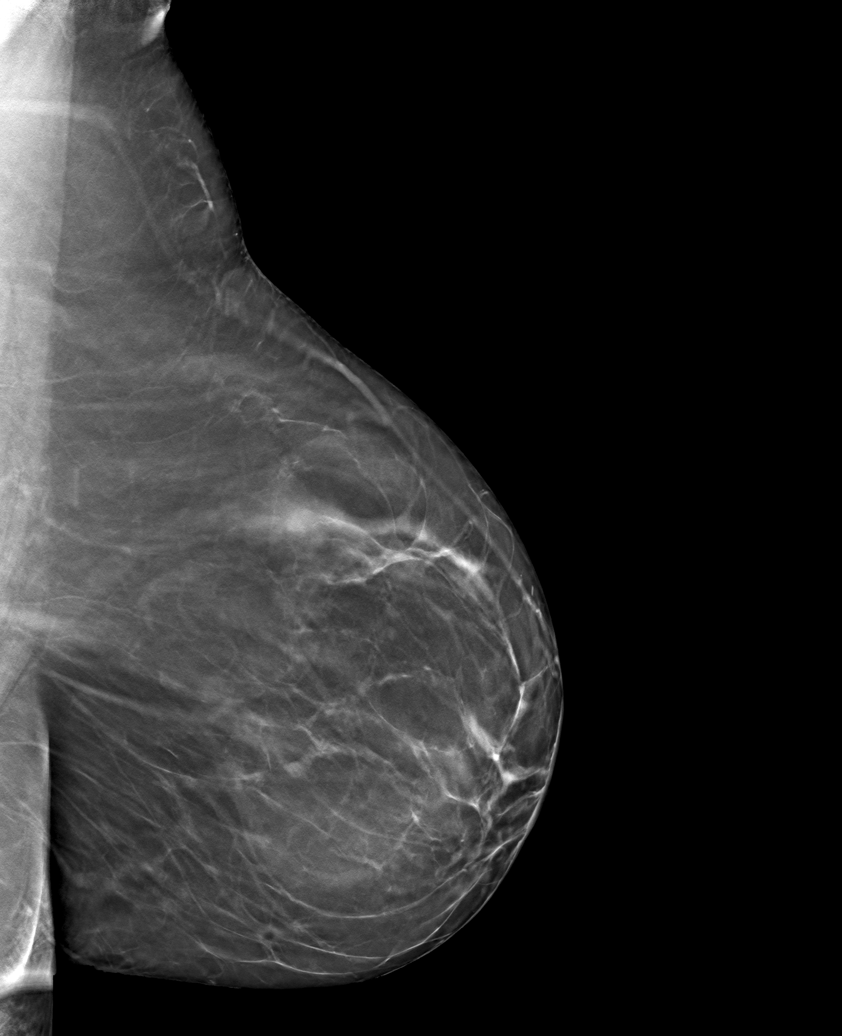

[6 of 30 positions shown; findings below may reference images not displayed]

ACR Breast Density Category b: There are scattered areas of
fibroglandular density.
FINDINGS: There are no findings suspicious for malignancy.
IMPRESSION: No mammographic evidence of malignancy. A result letter of this
screening mammogram will be mailed directly to the patient.

RECOMMENDATION:
Screening mammogram in one year. (Code:51-O-LD2)

BI-RADS CATEGORY  1: Negative.
